# Patient Record
Sex: Male | Born: 1990 | Race: Black or African American | Hispanic: No | Marital: Single | Smoking: Current every day smoker
Health system: Southern US, Community
[De-identification: ages and names within clinical notes are randomized; demographics above are authoritative.]

## PROBLEM LIST (undated history)

## (undated) DIAGNOSIS — J302 Other seasonal allergic rhinitis: Secondary | ICD-10-CM

## (undated) DIAGNOSIS — W3400XA Accidental discharge from unspecified firearms or gun, initial encounter: Secondary | ICD-10-CM

---
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
## 2010-01-01 ENCOUNTER — Emergency Department (HOSPITAL_COMMUNITY): Admission: EM | Admit: 2010-01-01 | Discharge: 2010-01-01 | Payer: Self-pay | Admitting: Emergency Medicine

## 2011-09-16 ENCOUNTER — Encounter (HOSPITAL_COMMUNITY): Payer: Self-pay | Admitting: *Deleted

## 2011-09-16 ENCOUNTER — Emergency Department (HOSPITAL_COMMUNITY)
Admission: EM | Admit: 2011-09-16 | Discharge: 2011-09-16 | Disposition: A | Discharge status: Home / Self Care | Payer: No Typology Code available for payment source | Attending: Emergency Medicine | Admitting: Emergency Medicine

## 2011-09-16 ENCOUNTER — Emergency Department (HOSPITAL_COMMUNITY): Payer: Self-pay

## 2011-09-16 DIAGNOSIS — F17200 Nicotine dependence, unspecified, uncomplicated: Secondary | ICD-10-CM | POA: Insufficient documentation

## 2011-09-16 DIAGNOSIS — R51 Headache: Secondary | ICD-10-CM | POA: Insufficient documentation

## 2011-09-16 DIAGNOSIS — M79609 Pain in unspecified limb: Secondary | ICD-10-CM | POA: Insufficient documentation

## 2011-09-16 DIAGNOSIS — IMO0001 Reserved for inherently not codable concepts without codable children: Secondary | ICD-10-CM | POA: Insufficient documentation

## 2011-09-16 DIAGNOSIS — M542 Cervicalgia: Secondary | ICD-10-CM | POA: Insufficient documentation

## 2011-09-16 DIAGNOSIS — R079 Chest pain, unspecified: Secondary | ICD-10-CM | POA: Insufficient documentation

## 2011-09-16 DIAGNOSIS — T148XXA Other injury of unspecified body region, initial encounter: Secondary | ICD-10-CM | POA: Insufficient documentation

## 2011-09-16 HISTORY — DX: Other seasonal allergic rhinitis: J30.2

## 2011-09-16 MED ORDER — CYCLOBENZAPRINE HCL 5 MG PO TABS: 5.0000 mg | ORAL_TABLET | Freq: Three times a day (TID) | ORAL | Status: AC | PRN

## 2011-09-16 MED ORDER — NAPROXEN 500 MG PO TABS: 500.0000 mg | ORAL_TABLET | Freq: Two times a day (BID) | ORAL | Status: DC
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
## 2011-11-13 ENCOUNTER — Emergency Department (HOSPITAL_COMMUNITY)
Admission: EM | Admit: 2011-11-13 | Discharge: 2011-11-13 | Disposition: A | Discharge status: Home / Self Care | Payer: Self-pay | Attending: Emergency Medicine | Admitting: Emergency Medicine

## 2011-11-13 ENCOUNTER — Encounter (HOSPITAL_COMMUNITY): Payer: Self-pay | Admitting: Emergency Medicine

## 2011-11-13 DIAGNOSIS — F17200 Nicotine dependence, unspecified, uncomplicated: Secondary | ICD-10-CM | POA: Insufficient documentation

## 2011-11-13 DIAGNOSIS — IMO0002 Reserved for concepts with insufficient information to code with codable children: Secondary | ICD-10-CM | POA: Insufficient documentation

## 2011-11-13 DIAGNOSIS — W57XXXA Bitten or stung by nonvenomous insect and other nonvenomous arthropods, initial encounter: Secondary | ICD-10-CM

## 2011-11-13 DIAGNOSIS — Y9289 Other specified places as the place of occurrence of the external cause: Secondary | ICD-10-CM | POA: Insufficient documentation

## 2011-11-13 DIAGNOSIS — S30861A Insect bite (nonvenomous) of abdominal wall, initial encounter: Secondary | ICD-10-CM

## 2011-11-13 NOTE — ED Notes (Signed)
Pt reports he had 4 ticks on him today and he removed them. One was on his testicles so he wanted to get the area checked.

## 2011-11-13 NOTE — ED Notes (Signed)
PT. LEFT WITHOUT NOTIFYING ED STAFF, NP NOTIFIED .

## 2011-11-13 NOTE — Discharge Instructions (Signed)
Return for any body aches, fevers or rash

## 2011-11-13 NOTE — ED Provider Notes (Signed)
History     CSN: [id number]  Arrival date & time 11/13/11  2044   First MD Initiated Contact with Patient 11/13/11 2248      Chief Complaint  Patient presents with  . Foreign Body in Skin    (Consider location/radiation/quality/duration/timing/severity/associated sxs/prior treatment) HPI Comments: As when fishing in the woods on Friday and today noticed he had several ticks in his groin area, which he is removed, but is concerned because he has some "knots in his right groin area.  Denies fever, myalgias, rash  The history is provided by the patient.    Past Medical History  Diagnosis Date  . Seasonal allergies     No past surgical history on file.  No family history on file.  History  Substance Use Topics  . Smoking status: Current Everyday Smoker    Types: Cigarettes  . Smokeless tobacco: Not on file  . Alcohol Use: No      Review of Systems  Constitutional: Negative for fever and chills.  Musculoskeletal: Negative for myalgias.  Neurological: Negative for dizziness and headaches.    Allergies  Review of patient's allergies indicates no known allergies.  Home Medications  No current outpatient prescriptions on file.  BP 126/71  Pulse 54  Temp 98.5 F (36.9 C)  Resp 16  SpO2 100%  Physical Exam  Constitutional: He is oriented to person, place, and time. He appears well-developed and well-nourished.  HENT:  Head: Normocephalic.  Eyes: Pupils are equal, round, and reactive to light.  Neck: Normal range of motion.  Cardiovascular: Normal rate.   Pulmonary/Chest: Effort normal.  Musculoskeletal: Normal range of motion. He exhibits no edema and no tenderness.  Neurological: He is alert and oriented to person, place, and time.  Skin: Skin is warm and dry.       Light erythema at the site of the tick bites without edema or tenderness    ED Course  Procedures (including critical care time)  Labs Reviewed - No data to display No results  found.   1. Tick bite of groin, initial encounter       MDM  Tick bites        Arman Filter, NP 11/13/11 2326  Arman Filter, NP 11/13/11 [phone]

## 2011-11-14 NOTE — ED Provider Notes (Signed)
Medical screening examination/treatment/procedure(s) were performed by non-physician practitioner and as supervising physician I was immediately available for consultation/collaboration.  Ethelda Chick, MD 11/14/11 [phone]

## 2012-05-11 ENCOUNTER — Emergency Department (HOSPITAL_COMMUNITY)
Admission: EM | Admit: 2012-05-11 | Discharge: 2012-05-11 | Disposition: A | Discharge status: Home / Self Care | Payer: Self-pay | Attending: Emergency Medicine | Admitting: Emergency Medicine

## 2012-05-11 ENCOUNTER — Encounter (HOSPITAL_COMMUNITY): Payer: Self-pay | Admitting: Physical Medicine and Rehabilitation

## 2012-05-11 ENCOUNTER — Emergency Department (HOSPITAL_COMMUNITY)
Admission: EM | Admit: 2012-05-11 | Discharge: 2012-05-11 | Disposition: A | Discharge status: Home / Self Care | Payer: Self-pay | Source: Home / Self Care

## 2012-05-11 DIAGNOSIS — L989 Disorder of the skin and subcutaneous tissue, unspecified: Secondary | ICD-10-CM | POA: Insufficient documentation

## 2012-05-11 DIAGNOSIS — F17200 Nicotine dependence, unspecified, uncomplicated: Secondary | ICD-10-CM | POA: Insufficient documentation

## 2012-05-11 LAB — URINE MICROSCOPIC-ADD ON

## 2012-05-11 LAB — URINALYSIS, ROUTINE W REFLEX MICROSCOPIC: Protein, ur: NEGATIVE mg/dL

## 2012-05-11 NOTE — ED Notes (Signed)
Discharge instructions reviewed. Pt verbalized understanding.  

## 2012-05-11 NOTE — ED Provider Notes (Cosign Needed)
History   This chart was scribed for Ward Givens, MD, by Frederik Pear. The patient was seen in room TR08C/TR08C and the patient's care was started at 1345.    CSN: [id number]  Arrival date & time 05/11/12  1158   First MD Initiated Contact with Patient 05/11/12 1258      Chief Complaint  Patient presents with  . SEXUALLY TRANSMITTED DISEASE    (Consider location/radiation/quality/duration/timing/severity/associated sxs/prior treatment) HPI  Derrick [name] is a 21 y.o. male who presents to the Emergency Department complaining of a mild, constant pen-tip sized raised lesion on the penis that appeared 3 days ago. He states he had mild pain. He denies any associated itching, bleeding, penile drip, or dysuria. He states the area has now "scabbed over" The pt has no new sexual partners or h/o of STD.   He is a current everyday smoker with no medical conditions other than season allergies. 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   PCP none  Past Medical History  Diagnosis Date  . Seasonal allergies     No past surgical history on file.  History reviewed. No pertinent family history.  History  Substance Use Topics  . Smoking status: Current Every Day Smoker    Types: Cigarettes  . Smokeless tobacco: Not on file  . Alcohol Use: No   employed   Review of Systems  Genitourinary: Positive for genital sores. Negative for dysuria.       Mass on penis.  All other systems reviewed and are negative.    Allergies  Review of patient's allergies indicates no known allergies.  Home Medications  No current outpatient prescriptions on file.  BP 129/60  Pulse 64  Temp 97.6 F (36.4 C) (Oral)  Resp 20  SpO2 99%  Vital signs normal    Physical Exam  Constitutional: He is oriented to person, place, and time. He appears well-developed and well-nourished.  Non-toxic appearance. He does not appear ill. No distress.  HENT:  Head: Normocephalic and atraumatic.    Right Ear: External ear normal.  Left Ear: External ear normal.  Nose: Nose normal. No mucosal edema or rhinorrhea.  Mouth/Throat: Mucous membranes are normal. No dental abscesses or uvula swelling.  Eyes: Conjunctivae normal and EOM are normal. Pupils are equal, round, and reactive to light.  Neck: Normal range of motion and full passive range of motion without pain. Neck supple.  Pulmonary/Chest: No respiratory distress. He has no rhonchi. He exhibits no crepitus.  Abdominal: Soft. Normal appearance.  Genitourinary: Testes normal and penis normal. Right testis shows no mass, no swelling and no tenderness. Left testis shows no mass, no swelling and no tenderness. Uncircumcised. No phimosis, paraphimosis, hypospadias or penile erythema. No discharge found.       No raised lesion or ulcers. No scabbing Small area about 3 mm in size with a difference in the skin tone  Musculoskeletal: Normal range of motion. He exhibits no edema and no tenderness.       Moves all extremities well.   Lymphadenopathy:       Right: Inguinal adenopathy present.       Left: Inguinal adenopathy present.       Multiple bilateral inguinal lymph nodes are present.  Neurological: He is alert and oriented to person, place, and time. He has normal strength. No cranial nerve deficit.  Skin: Skin is warm, dry and intact. No rash noted. No erythema. No pallor.  Psychiatric: He has a normal mood and  affect. His speech is normal and behavior is normal. His mood appears not anxious.    ED Course  Procedures (including critical care time)  DIAGNOSTIC STUDIES: Oxygen Saturation is 99% on room air, normal by my interpretation.    COORDINATION OF CARE:  13:48- Discussed planned course of treatment with the patient, including UA and blood work, who is agreeable at this time.     Results for orders placed during the hospital encounter of 05/11/12  URINALYSIS, ROUTINE W REFLEX MICROSCOPIC      Component Value Range    Color, Urine YELLOW  YELLOW   APPearance CLEAR  CLEAR   Specific Gravity, Urine 1.027  1.005 - 1.030   pH 6.0  5.0 - 8.0   Glucose, UA NEGATIVE  NEGATIVE mg/dL   Hgb urine dipstick NEGATIVE  NEGATIVE   Bilirubin Urine NEGATIVE  NEGATIVE   Ketones, ur 15 (*) NEGATIVE mg/dL   Protein, ur NEGATIVE  NEGATIVE mg/dL   Urobilinogen, UA 1.0  0.0 - 1.0 mg/dL   Nitrite NEGATIVE  NEGATIVE   Leukocytes, UA SMALL (*) NEGATIVE  URINE MICROSCOPIC-ADD ON      Component Value Range   WBC, UA 0-2  <3 WBC/hpf   Urine-Other MUCOUS PRESENT     Laboratory interpretation all normal except  GC, Chlamydia and RPR pending      1. Skin lesion    Plan discharge  Devoria Albe, MD, FACEP   MDM    I personally performed the services described in this documentation, which was scribed in my presence. The recorded information has been reviewed and considered.  Devoria Albe, MD, Armando Gang        Ward Givens, MD 05/11/12 [phone]

## 2012-05-11 NOTE — ED Notes (Signed)
Pt states he had a bump his penis 3 days ago and it went away and formed a scab. Denies urination difficulties or burning, and discharge.

## 2012-05-11 NOTE — ED Notes (Signed)
Pt presents to department for evaluation of possible STD. States he noticed small "bump" on penis x3 days ago. Denies penile discharge. Denies urinary symptoms. Denies pain at the time. No signs of distress noted.

## 2012-07-04 ENCOUNTER — Encounter (HOSPITAL_COMMUNITY): Payer: Self-pay | Admitting: *Deleted

## 2012-07-04 ENCOUNTER — Emergency Department (HOSPITAL_COMMUNITY)
Admission: EM | Admit: 2012-07-04 | Discharge: 2012-07-04 | Disposition: A | Discharge status: Home / Self Care | Payer: Self-pay | Attending: Emergency Medicine | Admitting: Emergency Medicine

## 2012-07-04 DIAGNOSIS — A6000 Herpesviral infection of urogenital system, unspecified: Secondary | ICD-10-CM | POA: Insufficient documentation

## 2012-07-04 DIAGNOSIS — F17200 Nicotine dependence, unspecified, uncomplicated: Secondary | ICD-10-CM | POA: Insufficient documentation

## 2012-07-04 MED ORDER — ACYCLOVIR 400 MG PO TABS: 400.0000 mg | ORAL_TABLET | Freq: Three times a day (TID) | ORAL | Status: DC

## 2012-07-04 MED ORDER — TRAMADOL HCL 50 MG PO TABS: 50.0000 mg | ORAL_TABLET | Freq: Four times a day (QID) | ORAL | Status: DC | PRN

## 2012-07-04 NOTE — ED Notes (Signed)
Per Pt:  Pt presents with a chief complaint of genital warts, no discharge.  Pt st's pain is 6-7/10.  Pt reports last sexual intercourse was 1-2 weeks ago.

## 2012-07-04 NOTE — ED Provider Notes (Signed)
History     CSN: [id number]  Arrival date & time 07/04/12  0700   First MD Initiated Contact with Patient 07/04/12 [phone]      No chief complaint on file.   (Consider location/radiation/quality/duration/timing/severity/associated sxs/prior treatment) HPI Pt has presented several times times to to ED for genital lesions and had neg GC, RPR. Now with several days of new outbreak of painful lesions on penis. No D/C. No systemic symptoms.  Past Medical History  Diagnosis Date  . Seasonal allergies     History reviewed. No pertinent past surgical history.  No family history on file.  History  Substance Use Topics  . Smoking status: Current Every Day Smoker    Types: Cigarettes  . Smokeless tobacco: Not on file  . Alcohol Use: No      Review of Systems  Constitutional: Negative for fever, chills and fatigue.  Genitourinary: Positive for penile pain. Negative for dysuria, hematuria, discharge, penile swelling, scrotal swelling and testicular pain.  Skin: Positive for rash.  All other systems reviewed and are negative.    Allergies  Review of patient's allergies indicates no known allergies.  Home Medications   Current Outpatient Rx  Name  Route  Sig  Dispense  Refill  . ACYCLOVIR 400 MG PO TABS   Oral   Take 1 tablet (400 mg total) by mouth 3 (three) times daily.   30 tablet   0   . TRAMADOL HCL 50 MG PO TABS   Oral   Take 1 tablet (50 mg total) by mouth every 6 (six) hours as needed for pain.   15 tablet   0     BP 134/57  Pulse 76  Temp 98.7 F (37.1 C) (Oral)  Resp 18  SpO2 97%  Physical Exam  Nursing note and vitals reviewed. Constitutional: He is oriented to person, place, and time. He appears well-developed and well-nourished. No distress.  HENT:  Head: Normocephalic and atraumatic.  Mouth/Throat: Oropharynx is clear and moist.  Eyes: Pupils are equal, round, and reactive to light.  Neck: Normal range of motion. Neck supple.  Cardiovascular:  Normal rate.   Pulmonary/Chest: Effort normal.  Abdominal: Soft. There is no tenderness. There is no rebound and no guarding.  Genitourinary:       Clustered vesicular lesions on shaft of penis with crusted over, healing lesions as well. +inguinal lymphadenopathy. No penile D/C. No testicular pain or mass  Musculoskeletal: Normal range of motion. He exhibits no edema and no tenderness.  Neurological: He is alert and oriented to person, place, and time.  Skin: Skin is warm and dry. No rash noted. No erythema.  Psychiatric: He has a normal mood and affect. His behavior is normal.    ED Course  Procedures (including critical care time)  Labs Reviewed - No data to display No results found.   1. Herpes genitalis       MDM  Advised to use barrier protection and follow up with [facility]        Loren Racer, MD 07/04/12 [phone]

## 2013-01-15 ENCOUNTER — Encounter (HOSPITAL_COMMUNITY): Payer: Self-pay | Admitting: *Deleted

## 2013-01-15 ENCOUNTER — Emergency Department (HOSPITAL_COMMUNITY)
Admission: EM | Admit: 2013-01-15 | Discharge: 2013-01-15 | Disposition: A | Discharge status: Home / Self Care | Payer: No Typology Code available for payment source | Attending: Emergency Medicine | Admitting: Emergency Medicine

## 2013-01-15 DIAGNOSIS — S39012A Strain of muscle, fascia and tendon of lower back, initial encounter: Secondary | ICD-10-CM

## 2013-01-15 DIAGNOSIS — S6990XA Unspecified injury of unspecified wrist, hand and finger(s), initial encounter: Secondary | ICD-10-CM | POA: Insufficient documentation

## 2013-01-15 DIAGNOSIS — S6980XA Other specified injuries of unspecified wrist, hand and finger(s), initial encounter: Secondary | ICD-10-CM | POA: Insufficient documentation

## 2013-01-15 DIAGNOSIS — Y92410 Unspecified street and highway as the place of occurrence of the external cause: Secondary | ICD-10-CM | POA: Insufficient documentation

## 2013-01-15 DIAGNOSIS — F17200 Nicotine dependence, unspecified, uncomplicated: Secondary | ICD-10-CM | POA: Insufficient documentation

## 2013-01-15 DIAGNOSIS — Y9389 Activity, other specified: Secondary | ICD-10-CM | POA: Insufficient documentation

## 2013-01-15 DIAGNOSIS — IMO0002 Reserved for concepts with insufficient information to code with codable children: Secondary | ICD-10-CM | POA: Insufficient documentation

## 2013-01-15 MED ORDER — NAPROXEN 500 MG PO TABS: 500.0000 mg | ORAL_TABLET | Freq: Two times a day (BID) | ORAL | Status: DC

## 2013-01-15 MED ORDER — METHOCARBAMOL 500 MG PO TABS: 500.0000 mg | ORAL_TABLET | Freq: Two times a day (BID) | ORAL | Status: DC

## 2013-01-15 NOTE — ED Notes (Signed)
Pt comfortable with d/c and f/u instructions. Prescriptions x2. 
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
## 2013-01-15 NOTE — ED Notes (Signed)
ED PA at bedside

## 2013-01-17 NOTE — ED Provider Notes (Signed)
Medical screening examination/treatment/procedure(s) were performed by non-physician practitioner and as supervising physician I was immediately available for consultation/collaboration.   Richardean Canal, MD 01/17/13 [phone]

## 2014-06-01 ENCOUNTER — Encounter (HOSPITAL_COMMUNITY): Payer: Self-pay | Admitting: *Deleted

## 2014-06-01 ENCOUNTER — Emergency Department (HOSPITAL_COMMUNITY)
Admission: EM | Admit: 2014-06-01 | Discharge: 2014-06-01 | Disposition: A | Discharge status: Home / Self Care | Payer: No Typology Code available for payment source | Attending: Emergency Medicine | Admitting: Emergency Medicine

## 2014-06-01 DIAGNOSIS — L0291 Cutaneous abscess, unspecified: Secondary | ICD-10-CM

## 2014-06-01 DIAGNOSIS — Z79899 Other long term (current) drug therapy: Secondary | ICD-10-CM | POA: Insufficient documentation

## 2014-06-01 DIAGNOSIS — L02215 Cutaneous abscess of perineum: Secondary | ICD-10-CM | POA: Insufficient documentation

## 2014-06-01 DIAGNOSIS — Z720 Tobacco use: Secondary | ICD-10-CM | POA: Insufficient documentation

## 2014-06-01 MED ORDER — LIDOCAINE-EPINEPHRINE (PF) 2 %-1:200000 IJ SOLN
10.0000 mL | Freq: Once | INTRAMUSCULAR | Status: AC
  Administered 2014-06-01: 10 mL
  Filled 2014-06-01: qty 20

## 2014-06-01 MED ORDER — CEPHALEXIN 500 MG PO CAPS: 500.0000 mg | ORAL_CAPSULE | Freq: Four times a day (QID) | ORAL | Status: DC

## 2014-06-01 MED ORDER — OXYCODONE-ACETAMINOPHEN 5-325 MG PO TABS: 2.0000 | ORAL_TABLET | Freq: Four times a day (QID) | ORAL | Status: DC | PRN

## 2014-06-01 NOTE — ED Notes (Signed)
Pt in c/o possible abscess to base of testicles, states this has been there for a few days, denies drainage, c/o pain with movement

## 2014-06-01 NOTE — ED Provider Notes (Signed)
CSN: [id number]     Arrival date & time 06/01/14  1846 History   First MD Initiated Contact with Patient 06/01/14 2035     Chief Complaint  Patient presents with  . Abscess     (Consider location/radiation/quality/duration/timing/severity/associated sxs/prior Treatment) HPI Comments: Patient presents to the emergency department with chief complaint of mass. He states that he has noticed a painful mass of the base of his testicles. States that his been there for the past 3 days. He states that his been growing. He reports increased pain. He denies any associated fevers, chills, nausea, vomiting, or discharge. It is worsened with palpation and movement. He has never had anything like this before. He has no pertinent past medical history.  The history is provided by the patient. No language interpreter was used.    Past Medical History  Diagnosis Date  . Seasonal allergies    History reviewed. No pertinent past surgical history. History reviewed. No pertinent family history. History  Substance Use Topics  . Smoking status: Current Every Day Smoker    Types: Cigarettes  . Smokeless tobacco: Not on file  . Alcohol Use: No    Review of Systems  Constitutional: Negative for fever and chills.  Respiratory: Negative for shortness of breath.   Cardiovascular: Negative for chest pain.  Gastrointestinal: Negative for nausea, vomiting, diarrhea and constipation.  Genitourinary: Negative for dysuria.  All other systems reviewed and are negative.     Allergies  Review of patient's allergies indicates no known allergies.  Home Medications   Prior to Admission medications   Medication Sig Start Date End Date Taking? Authorizing Provider  methocarbamol (ROBAXIN) 500 MG tablet Take 1 tablet (500 mg total) by mouth 2 (two) times daily. Patient not taking: Reported on 06/01/2014 01/15/13   Antony MaduraKelly Humes, PA-C  naproxen (NAPROSYN) 500 MG tablet Take 1 tablet (500 mg total) by mouth 2 (two)  times daily. Patient not taking: Reported on 06/01/2014 01/15/13   Antony MaduraKelly Humes, PA-C   BP 117/67 mmHg  Pulse 71  Temp(Src) 97.5 F (36.4 C)  Resp 18  Ht 5\' 7"  (1.702 m)  Wt 155 lb (70.308 kg)  BMI 24.27 kg/m2  SpO2 100% Physical Exam  Constitutional: He is oriented to person, place, and time. He appears well-developed and well-nourished.  HENT:  Head: Normocephalic and atraumatic.  Eyes: Conjunctivae and EOM are normal. Pupils are equal, round, and reactive to light. Right eye exhibits no discharge. Left eye exhibits no discharge. No scleral icterus.  Neck: Normal range of motion. Neck supple. No JVD present.  Cardiovascular: Normal rate, regular rhythm and normal heart sounds.  Exam reveals no gallop and no friction rub.   No murmur heard. Pulmonary/Chest: Effort normal and breath sounds normal. No respiratory distress. He has no wheezes. He has no rales. He exhibits no tenderness.  Abdominal: Soft. He exhibits no distension and no mass. There is no tenderness. There is no rebound and no guarding.  Genitourinary:  4 x 3 cm fluctuant area in the perineum bordering the scrotum concerning for abscess  No tenderness of the testicles, no abnormality about the penis, no discharge  Musculoskeletal: Normal range of motion. He exhibits no edema or tenderness.  Neurological: He is alert and oriented to person, place, and time.  Skin: Skin is warm and dry.  Psychiatric: He has a normal mood and affect. His behavior is normal. Judgment and thought content normal.  Nursing note and vitals reviewed.   ED Course  Procedures (including  critical care time) Results for orders placed or performed during the hospital encounter of 05/11/12  Urinalysis, Routine w reflex microscopic  Result Value Ref Range   Color, Urine YELLOW YELLOW   APPearance CLEAR CLEAR   Specific Gravity, Urine 1.027 1.005 - 1.030   pH 6.0 5.0 - 8.0   Glucose, UA NEGATIVE NEGATIVE mg/dL   Hgb urine dipstick NEGATIVE  NEGATIVE   Bilirubin Urine NEGATIVE NEGATIVE   Ketones, ur 15 (A) NEGATIVE mg/dL   Protein, ur NEGATIVE NEGATIVE mg/dL   Urobilinogen, UA 1.0 0.0 - 1.0 mg/dL   Nitrite NEGATIVE NEGATIVE   Leukocytes, UA SMALL (A) NEGATIVE  Urine microscopic-add on  Result Value Ref Range   WBC, UA 0-2 <3 WBC/hpf   Urine-Other MUCOUS PRESENT   GC/chlamydia probe amp, genital  Result Value Ref Range   GC Probe Amp, Genital NEGATIVE NEGATIVE   Chlamydia, DNA Probe NEGATIVE NEGATIVE  RPR  Result Value Ref Range   RPR NON REACTIVE NON REACTIVE   No results found.     EKG Interpretation None      MDM   Final diagnoses:  Abscess   Patient with fluctuant mass concerning for abscess in the perineum bordering the scrotum.  Patient seen by and discussed with Dr. Jodi MourningZavitz.  Incision and drainage performed by Dr. Jodi MourningZavitz.  Bedside soft tissue US performed prior to I and D.    Will discharge to home with abx, pain meds, close return precautions and instructions to follow-up with urology in 2-3 days.    Roxy Horsemanobert Marti Mclane, PA-C 06/01/14 [phone]  Enid SkeensJoshua M Zavitz, MD 06/02/14 [phone]

## 2014-06-01 NOTE — ED Notes (Signed)
MD and PA at the bedside attempting to drain abscess.

## 2014-06-01 NOTE — Discharge Instructions (Signed)

## 2014-06-04 LAB — WOUND CULTURE

## 2014-07-17 DIAGNOSIS — W3400XA Accidental discharge from unspecified firearms or gun, initial encounter: Secondary | ICD-10-CM

## 2014-07-17 DIAGNOSIS — Y249XXA Unspecified firearm discharge, undetermined intent, initial encounter: Secondary | ICD-10-CM

## 2014-07-17 HISTORY — DX: Accidental discharge from unspecified firearms or gun, initial encounter: W34.00XA

## 2014-07-17 HISTORY — DX: Unspecified firearm discharge, undetermined intent, initial encounter: Y24.9XXA

## 2015-06-29 DIAGNOSIS — W3400XA Accidental discharge from unspecified firearms or gun, initial encounter: Secondary | ICD-10-CM | POA: Insufficient documentation

## 2015-06-29 DIAGNOSIS — S36039A Unspecified laceration of spleen, initial encounter: Secondary | ICD-10-CM | POA: Insufficient documentation

## 2015-06-29 DIAGNOSIS — Y249XXA Unspecified firearm discharge, undetermined intent, initial encounter: Secondary | ICD-10-CM | POA: Insufficient documentation
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
## 2015-06-29 NOTE — ED Notes (Signed)
 Xray at bedside for portable exams
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
## 2015-06-29 NOTE — ED Notes (Signed)
 Patient was dropped off by friends at the main entrance of the hospital. Security transported patient to ED. At that time a Level 1 was paged out.

## 2015-07-04 NOTE — Unmapped External Note (Signed)
 Dressing saturated, removed, new dressing applied.  Electronically signed by: Bernarda Rumalda Colonel, RN 07/04/15 1536

## 2015-07-05 NOTE — Unmapped External Note (Signed)
 Care Coordination Update  Date: 07/05/2015   Time: 2:56 PM   Inpatient CM spoke with pt to update insurance status.  Pt stated that he was unsure of insurance coverage, that his mother handled that for him.  He would speak with his mother and obtain information.  Will continue to follow for discharge needs  Diamond Donovan, RN  Case Manager   Electronically signed by: Diamond Donovan, RN 07/05/15 [phone]

## 2015-07-05 NOTE — Unmapped External Note (Signed)
 Pt evaluated by RT per protocol. Pt will continue with current therapies at this time. RT will continue to monitor and adjust therapies as indicated.   07/05/15 2329  RT Evaluation  $ Subsequent Patient Assessment Yes  Eval and Treat Assessment  Respiratory Pattern Regular;Easy;Unlabored  Chest Assessment Chest expansion symmetrical  Cough Strong;Productive  Bilateral Breath Sounds Diminished;Clear  R Breath Sounds Diminished;Clear  L Breath Sounds Diminished;Clear  SpO2 100 %  O2 Device None (Room air)  FiO2 (%) 21 %  Tobacco use reviewed  Reviewed  Past Medical Hx of Respiratory Compromise/Disorder?  No  Respiratory Compromise Hx Recent Surgery Patient  Home Regimen No  Cough Description  Sputum Amount UTA  Sputum Color UTA  Sputum Consistency UTA  Sputum How Obtained Spontaneous cough  Imaging Results  Latest Image Results 07/05/15  Chest X-ray Atelectasis;Other (comment)  X-ray Results comment 1. Trace left apical pneumothorax with left chest tube in place. 2. Bibasilar atelectasis and left basilar contusion.  RT Recommendations  Bronchodilator Indications Not Indicated  Airway Clearance Indications Not indicated  Airway Clearance Recommendations Incentive Spirometry  Frequency (q1 while awake)  Airway Clearance Recommendations Turn, Deep Breathe, and Cough  Frequency q2 hrs (while awake)  Resp. Therapy Follow-up PRN    Electronically signed by: Pecola Christobal Quint, RRT 07/05/15 2333

## 2015-07-18 HISTORY — PX: EXPLORATORY LAPAROTOMY: SUR591

## 2016-05-25 ENCOUNTER — Encounter (HOSPITAL_COMMUNITY): Payer: Self-pay

## 2016-05-25 DIAGNOSIS — W2201XA Walked into wall, initial encounter: Secondary | ICD-10-CM | POA: Insufficient documentation

## 2016-05-25 DIAGNOSIS — Y929 Unspecified place or not applicable: Secondary | ICD-10-CM | POA: Insufficient documentation

## 2016-05-25 DIAGNOSIS — Y999 Unspecified external cause status: Secondary | ICD-10-CM | POA: Insufficient documentation

## 2016-05-25 DIAGNOSIS — Z87891 Personal history of nicotine dependence: Secondary | ICD-10-CM | POA: Insufficient documentation

## 2016-05-25 DIAGNOSIS — M79641 Pain in right hand: Secondary | ICD-10-CM | POA: Insufficient documentation

## 2016-05-25 DIAGNOSIS — Y9389 Activity, other specified: Secondary | ICD-10-CM | POA: Insufficient documentation
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
## 2016-05-26 ENCOUNTER — Emergency Department (HOSPITAL_COMMUNITY)
Admission: EM | Admit: 2016-05-26 | Discharge: 2016-05-26 | Disposition: A | Discharge status: Home / Self Care | Payer: Self-pay | Attending: Emergency Medicine | Admitting: Emergency Medicine

## 2016-05-26 ENCOUNTER — Emergency Department (HOSPITAL_COMMUNITY): Payer: Self-pay

## 2016-05-26 DIAGNOSIS — M79641 Pain in right hand: Secondary | ICD-10-CM

## 2016-05-26 HISTORY — DX: Accidental discharge from unspecified firearms or gun, initial encounter: W34.00XA

## 2016-05-26 MED ORDER — IBUPROFEN 600 MG PO TABS: 600.0000 mg | ORAL_TABLET | Freq: Three times a day (TID) | ORAL | 0 refills | Status: DC

## 2016-05-26 NOTE — ED Notes (Signed)
Pt states he only wants to address his hand injury, does not want to work up the abd pain

## 2016-05-26 NOTE — Progress Notes (Signed)
Orthopedic Tech Progress Note Patient Details:  Rexene AgentDeondre L Orton 07/16/1991 [id number]  Ortho Devices Type of Ortho Device: Short arm splint Ortho Device/Splint Location: Volar Splint/ RUE Ortho Device/Splint Interventions: Application As per Doctor's orders.  Alvina ChouWilliams, Marielle Mantione C 05/26/2016, 4:35 AM
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
## 2016-08-25 ENCOUNTER — Encounter (HOSPITAL_COMMUNITY): Payer: Self-pay | Admitting: *Deleted

## 2016-08-25 ENCOUNTER — Emergency Department (HOSPITAL_COMMUNITY): Payer: Self-pay

## 2016-08-25 ENCOUNTER — Emergency Department (HOSPITAL_COMMUNITY)
Admission: EM | Admit: 2016-08-25 | Discharge: 2016-08-25 | Disposition: A | Discharge status: Home / Self Care | Payer: Self-pay | Attending: Emergency Medicine | Admitting: Emergency Medicine

## 2016-08-25 DIAGNOSIS — Z87891 Personal history of nicotine dependence: Secondary | ICD-10-CM | POA: Insufficient documentation

## 2016-08-25 DIAGNOSIS — S62346A Nondisplaced fracture of base of fifth metacarpal bone, right hand, initial encounter for closed fracture: Secondary | ICD-10-CM | POA: Insufficient documentation

## 2016-08-25 DIAGNOSIS — Y929 Unspecified place or not applicable: Secondary | ICD-10-CM | POA: Insufficient documentation

## 2016-08-25 DIAGNOSIS — Y999 Unspecified external cause status: Secondary | ICD-10-CM | POA: Insufficient documentation

## 2016-08-25 DIAGNOSIS — Y939 Activity, unspecified: Secondary | ICD-10-CM | POA: Insufficient documentation

## 2016-08-25 DIAGNOSIS — W2201XA Walked into wall, initial encounter: Secondary | ICD-10-CM | POA: Insufficient documentation

## 2016-08-25 MED ORDER — HYDROCODONE-ACETAMINOPHEN 5-325 MG PO TABS: 2.0000 | ORAL_TABLET | ORAL | 0 refills | Status: DC | PRN

## 2016-08-25 NOTE — Discharge Instructions (Signed)
Return if any problems.

## 2016-08-25 NOTE — ED Notes (Signed)
Patient transported to X-ray 

## 2016-08-25 NOTE — ED Notes (Signed)
Paged ortho tech x 2.
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
## 2016-08-25 NOTE — Progress Notes (Signed)
Orthopedic Tech Progress Note Patient Details:  Derrick [name] 08/14/1990 [id number]  Ortho Devices Type of Ortho Device: Ace wrap, Arm sling Ortho Device/Splint Location: rue Ortho Device/Splint Interventions: Application   [name] 08/25/2016, 10:14 AM As ordered by Dr. Jeanette CapriceSophia
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
## 2016-08-31 DIAGNOSIS — S62346A Nondisplaced fracture of base of fifth metacarpal bone, right hand, initial encounter for closed fracture: Secondary | ICD-10-CM | POA: Insufficient documentation

## 2016-09-20 ENCOUNTER — Encounter (HOSPITAL_COMMUNITY): Payer: Self-pay | Admitting: Emergency Medicine

## 2016-09-20 ENCOUNTER — Emergency Department (HOSPITAL_COMMUNITY)
Admission: EM | Admit: 2016-09-20 | Discharge: 2016-09-20 | Disposition: A | Discharge status: Home / Self Care | Payer: Self-pay | Attending: Emergency Medicine | Admitting: Emergency Medicine

## 2016-09-20 DIAGNOSIS — L02224 Furuncle of groin: Secondary | ICD-10-CM | POA: Insufficient documentation

## 2016-09-20 DIAGNOSIS — Z87891 Personal history of nicotine dependence: Secondary | ICD-10-CM | POA: Insufficient documentation

## 2016-09-20 DIAGNOSIS — L0291 Cutaneous abscess, unspecified: Secondary | ICD-10-CM

## 2016-09-20 MED ORDER — DEXAMETHASONE SODIUM PHOSPHATE 10 MG/ML IJ SOLN: 10.0000 mg | Freq: Once | INTRAMUSCULAR | Status: DC

## 2016-09-20 MED ORDER — LIDOCAINE HCL (PF) 1 % IJ SOLN
5.0000 mL | Freq: Once | INTRAMUSCULAR | Status: AC
  Administered 2016-09-20: 5 mL
  Filled 2016-09-20: qty 5

## 2016-09-20 MED ORDER — KETOROLAC TROMETHAMINE 30 MG/ML IJ SOLN: 30.0000 mg | Freq: Once | INTRAMUSCULAR | Status: DC

## 2016-09-20 MED ORDER — SODIUM BICARBONATE 4 % IV SOLN
5.0000 mL | Freq: Once | INTRAVENOUS | Status: AC
  Administered 2016-09-20: 5 mL via SUBCUTANEOUS
  Filled 2016-09-20: qty 5

## 2016-09-20 MED ORDER — SODIUM CHLORIDE 0.9 % IV BOLUS (SEPSIS): 1000.0000 mL | Freq: Once | INTRAVENOUS | Status: DC

## 2016-09-20 NOTE — Discharge Instructions (Signed)
Please have your area of incision and drainage rechecked in 2 days by primary care provider or urgent care or return here for reevaluation. Use warm soaks and flushing to the area.  Get help right away if: You have severe pain or bleeding. You cannot eat or drink without vomiting. You have decreased urine output. You become short of breath. You have chest pain. You cough up blood. The area where the incision and drainage occurred becomes numb or it tingles.

## 2016-09-20 NOTE — ED Triage Notes (Signed)
Pt states he has a red painful sore on his buttocks for 1 week.

## 2016-09-20 NOTE — ED Provider Notes (Signed)
Medical screening examination/treatment/procedure(s) were conducted as a shared visit with non-physician practitioner(s) and myself.  I personally evaluated the patient during the encounter.   EKG Interpretation None      26 y.o. male presents with abscess to right groin. No acute distress on exam. Small indurated area to right inguinal crease with no extension into scrotum. I&D, no antibiotics indicated without evidence of cellulitis  See related encounter note  EMERGENCY DEPARTMENT US SOFT TISSUE INTERPRETATION "Study: Limited Soft Tissue Ultrasound"  INDICATIONS: Soft tissue infection Multiple views of the body part were obtained in real-time with a multi-frequency linear probe  PERFORMED BY: Myself IMAGES ARCHIVED?: Yes SIDE:Right  BODY PART:groin INTERPRETATION:  Abcess present and No cellulitis noted     Lyndal Pulleyaniel Gigi Onstad, MD 09/20/16 1729
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
## 2017-04-12 ENCOUNTER — Ambulatory Visit (INDEPENDENT_AMBULATORY_CARE_PROVIDER_SITE_OTHER): Payer: Self-pay | Admitting: Physician Assistant

## 2017-04-12 ENCOUNTER — Encounter (INDEPENDENT_AMBULATORY_CARE_PROVIDER_SITE_OTHER): Payer: Self-pay | Admitting: Physician Assistant

## 2017-04-12 VITALS — BP 118/78 | HR 68 | Temp 97.7°F | Wt 173.0 lb

## 2017-04-12 DIAGNOSIS — R109 Unspecified abdominal pain: Secondary | ICD-10-CM

## 2017-04-12 DIAGNOSIS — Q8901 Asplenia (congenital): Secondary | ICD-10-CM

## 2017-04-12 DIAGNOSIS — Z114 Encounter for screening for human immunodeficiency virus [HIV]: Secondary | ICD-10-CM

## 2017-04-12 LAB — POCT URINALYSIS DIPSTICK
BILIRUBIN UA: NEGATIVE
GLUCOSE UA: NEGATIVE
Leukocytes, UA: NEGATIVE
Nitrite, UA: NEGATIVE
RBC UA: NEGATIVE
SPEC GRAV UA: 1.025 (ref 1.010–1.025)
UROBILINOGEN UA: 1 U/dL
pH, UA: 6 (ref 5.0–8.0)

## 2017-04-12 MED ORDER — NAPROXEN 500 MG PO TABS: 500.0000 mg | ORAL_TABLET | Freq: Two times a day (BID) | ORAL | 0 refills | Status: DC

## 2017-04-12 NOTE — Patient Instructions (Signed)
Flank Pain, Adult Flank pain is pain that is located on the side of the body between the upper abdomen and the back. This area is called the flank. The pain may occur over a short period of time (acute), or it may be long-term or recurring (chronic). It may be mild or severe. Flank pain can be caused by many things, including:  Muscle soreness or injury.  Kidney stones or kidney disease.  Stress.  A disease of the spine (vertebral disk disease).  A lung infection (pneumonia).  Fluid around the lungs (pulmonary edema).  A skin rash caused by the chickenpox virus (shingles).  Tumors that affect the back of the abdomen.  Gallbladder disease.  Follow these instructions at home:  Drink enough fluid to keep your urine clear or pale yellow.  Rest as told by your health care provider.  Take over-the-counter and prescription medicines only as told by your health care provider.  Keep a journal to track what has caused your flank pain and what has made it feel better.  Keep all follow-up visits as told by your health care provider. This is important. Contact a health care provider if:  Your pain is not controlled with medicine.  You have new symptoms.  Your pain gets worse.  You have a fever.  Your symptoms last longer than 2-3 days.  You have trouble urinating or you are urinating very frequently. Get help right away if:  You have trouble breathing or you are short of breath.  Your abdomen hurts or it is swollen or red.  You have nausea or vomiting.  You feel faint or you pass out.  You have blood in your urine. Summary  Flank pain is pain that is located on the side of the body between the upper abdomen and the back.  The pain may occur over a short period of time (acute), or it may be long-term or recurring (chronic). It may be mild or severe.  Flank pain can be caused by many things.  Contact your health care provider if your symptoms get worse or they last  longer than 2-3 days. This information is not intended to replace advice given to you by your health care provider. Make sure you discuss any questions you have with your health care provider. Document Released: 08/24/2005 Document Revised: 09/15/2016 Document Reviewed: 09/15/2016 Elsevier Interactive Patient Education  2018 Elsevier Inc.  
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
## 2017-04-26 ENCOUNTER — Ambulatory Visit (INDEPENDENT_AMBULATORY_CARE_PROVIDER_SITE_OTHER): Payer: Self-pay | Admitting: Physician Assistant

## 2017-12-07 ENCOUNTER — Emergency Department (HOSPITAL_COMMUNITY)
Admission: EM | Admit: 2017-12-07 | Discharge: 2017-12-07 | Disposition: A | Discharge status: Home / Self Care | Payer: Self-pay | Attending: Emergency Medicine | Admitting: Emergency Medicine

## 2017-12-07 ENCOUNTER — Encounter (HOSPITAL_COMMUNITY): Payer: Self-pay | Admitting: Emergency Medicine

## 2017-12-07 ENCOUNTER — Other Ambulatory Visit: Payer: Self-pay

## 2017-12-07 DIAGNOSIS — Z87891 Personal history of nicotine dependence: Secondary | ICD-10-CM | POA: Insufficient documentation

## 2017-12-07 DIAGNOSIS — S0990XA Unspecified injury of head, initial encounter: Secondary | ICD-10-CM

## 2017-12-07 DIAGNOSIS — Y9259 Other trade areas as the place of occurrence of the external cause: Secondary | ICD-10-CM | POA: Insufficient documentation

## 2017-12-07 DIAGNOSIS — Z23 Encounter for immunization: Secondary | ICD-10-CM | POA: Insufficient documentation

## 2017-12-07 DIAGNOSIS — S0001XA Abrasion of scalp, initial encounter: Secondary | ICD-10-CM | POA: Insufficient documentation

## 2017-12-07 DIAGNOSIS — Y999 Unspecified external cause status: Secondary | ICD-10-CM | POA: Insufficient documentation

## 2017-12-07 DIAGNOSIS — R400 Somnolence: Secondary | ICD-10-CM | POA: Insufficient documentation

## 2017-12-07 DIAGNOSIS — Y939 Activity, unspecified: Secondary | ICD-10-CM | POA: Insufficient documentation

## 2017-12-07 MED ORDER — TETANUS-DIPHTH-ACELL PERTUSSIS 5-2.5-18.5 LF-MCG/0.5 IM SUSP
0.5000 mL | Freq: Once | INTRAMUSCULAR | Status: AC
  Administered 2017-12-07: 0.5 mL via INTRAMUSCULAR
  Filled 2017-12-07: qty 0.5

## 2017-12-07 NOTE — Discharge Instructions (Addendum)
Use a soap and water wash to your scalp wound twice daily and plan a recheck by your doctor or by returning here if you have any signs of infection (redness, worse swelling or drainage) from the wound site.  Refer to the minor head injury instructions and get rechecked if you develop any new symptoms.  As discussed, this this injury occurred yesterday, it is unlikely you will develop any of these new symptoms.
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
## 2018-09-02 ENCOUNTER — Emergency Department (HOSPITAL_BASED_OUTPATIENT_CLINIC_OR_DEPARTMENT_OTHER)
Admission: EM | Admit: 2018-09-02 | Discharge: 2018-09-03 | Disposition: A | Discharge status: Home / Self Care | Payer: No Typology Code available for payment source | Attending: Emergency Medicine | Admitting: Emergency Medicine

## 2018-09-02 ENCOUNTER — Other Ambulatory Visit: Payer: Self-pay

## 2018-09-02 ENCOUNTER — Encounter (HOSPITAL_BASED_OUTPATIENT_CLINIC_OR_DEPARTMENT_OTHER): Payer: Self-pay

## 2018-09-02 ENCOUNTER — Emergency Department (HOSPITAL_BASED_OUTPATIENT_CLINIC_OR_DEPARTMENT_OTHER): Payer: No Typology Code available for payment source

## 2018-09-02 DIAGNOSIS — Y999 Unspecified external cause status: Secondary | ICD-10-CM | POA: Insufficient documentation

## 2018-09-02 DIAGNOSIS — S161XXA Strain of muscle, fascia and tendon at neck level, initial encounter: Secondary | ICD-10-CM

## 2018-09-02 DIAGNOSIS — Z79899 Other long term (current) drug therapy: Secondary | ICD-10-CM | POA: Insufficient documentation

## 2018-09-02 DIAGNOSIS — Y9389 Activity, other specified: Secondary | ICD-10-CM | POA: Diagnosis not present

## 2018-09-02 DIAGNOSIS — S199XXA Unspecified injury of neck, initial encounter: Secondary | ICD-10-CM | POA: Diagnosis present

## 2018-09-02 DIAGNOSIS — S93602A Unspecified sprain of left foot, initial encounter: Secondary | ICD-10-CM

## 2018-09-02 DIAGNOSIS — F17210 Nicotine dependence, cigarettes, uncomplicated: Secondary | ICD-10-CM | POA: Diagnosis not present

## 2018-09-02 DIAGNOSIS — Y92410 Unspecified street and highway as the place of occurrence of the external cause: Secondary | ICD-10-CM | POA: Diagnosis not present

## 2018-09-02 DIAGNOSIS — R109 Unspecified abdominal pain: Secondary | ICD-10-CM

## 2018-09-02 NOTE — ED Notes (Signed)
ED Provider at bedside. 
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
## 2018-09-03 MED ORDER — NAPROXEN 500 MG PO TABS: ORAL_TABLET | ORAL | 0 refills | Status: DC

## 2018-09-03 MED ORDER — NAPROXEN 250 MG PO TABS
500.0000 mg | ORAL_TABLET | Freq: Once | ORAL | Status: AC
  Administered 2018-09-03: 500 mg via ORAL
  Filled 2018-09-03: qty 2

## 2019-01-01 ENCOUNTER — Ambulatory Visit (HOSPITAL_COMMUNITY)
Admission: EM | Admit: 2019-01-01 | Discharge: 2019-01-01 | Disposition: A | Discharge status: Home / Self Care | Payer: Self-pay | Attending: Internal Medicine | Admitting: Internal Medicine

## 2019-01-01 ENCOUNTER — Encounter (HOSPITAL_COMMUNITY): Payer: Self-pay | Admitting: Emergency Medicine

## 2019-01-01 ENCOUNTER — Other Ambulatory Visit: Payer: Self-pay

## 2019-01-01 ENCOUNTER — Ambulatory Visit (INDEPENDENT_AMBULATORY_CARE_PROVIDER_SITE_OTHER): Payer: Self-pay

## 2019-01-01 DIAGNOSIS — S6991XA Unspecified injury of right wrist, hand and finger(s), initial encounter: Secondary | ICD-10-CM

## 2019-01-01 DIAGNOSIS — S62346A Nondisplaced fracture of base of fifth metacarpal bone, right hand, initial encounter for closed fracture: Secondary | ICD-10-CM

## 2019-01-01 DIAGNOSIS — W228XXA Striking against or struck by other objects, initial encounter: Secondary | ICD-10-CM

## 2019-01-01 NOTE — Progress Notes (Signed)
Orthopedic Tech Progress Note Patient Details:  Derrick [name] June 25, 1991 [id number]  Ortho Devices Type of Ortho Device: Arm sling, Ulna gutter splint Ortho Device/Splint Location: URL Ortho Device/Splint Interventions: Adjustment, Application, Ordered   Post Interventions Patient Tolerated: Well Instructions Provided: Care of device, Adjustment of device   Janit Pagan 01/01/2019, 4:13 PM

## 2019-01-01 NOTE — Discharge Instructions (Signed)
You do have a fracture in your hand.  We will place you in a splint Rest, ice and elevate.  You need to follow-up with orthopedic within the next week.
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
## 2019-11-14 ENCOUNTER — Other Ambulatory Visit: Payer: Self-pay

## 2019-11-14 ENCOUNTER — Emergency Department (HOSPITAL_COMMUNITY)
Admission: EM | Admit: 2019-11-14 | Discharge: 2019-11-14 | Disposition: A | Discharge status: Home / Self Care | Payer: Self-pay | Attending: Emergency Medicine | Admitting: Emergency Medicine

## 2019-11-14 ENCOUNTER — Encounter (HOSPITAL_COMMUNITY): Payer: Self-pay | Admitting: Emergency Medicine

## 2019-11-14 DIAGNOSIS — F17210 Nicotine dependence, cigarettes, uncomplicated: Secondary | ICD-10-CM | POA: Insufficient documentation

## 2019-11-14 DIAGNOSIS — R112 Nausea with vomiting, unspecified: Secondary | ICD-10-CM | POA: Insufficient documentation

## 2019-11-14 DIAGNOSIS — R197 Diarrhea, unspecified: Secondary | ICD-10-CM | POA: Insufficient documentation

## 2019-11-14 LAB — COMPREHENSIVE METABOLIC PANEL
ALT: 35 U/L (ref 0–44)
AST: 30 U/L (ref 15–41)
Albumin: 4.6 g/dL (ref 3.5–5.0)
Alkaline Phosphatase: 58 U/L (ref 38–126)
Anion gap: 13 (ref 5–15)
BUN: 15 mg/dL (ref 6–20)
CO2: 23 mmol/L (ref 22–32)
Calcium: 9.6 mg/dL (ref 8.9–10.3)
Chloride: 103 mmol/L (ref 98–111)
Creatinine, Ser: 1.14 mg/dL (ref 0.61–1.24)
GFR calc Af Amer: >60 mL/min (ref >60)
GFR calc non Af Amer: >60 mL/min (ref >60)
Glucose, Bld: 104 mg/dL — ABNORMAL HIGH (ref 70–99)
Potassium: 4.1 mmol/L (ref 3.5–5.1)
Sodium: 139 mmol/L (ref 135–145)
Total Bilirubin: 1.1 mg/dL (ref 0.3–1.2)
Total Protein: 7.1 g/dL (ref 6.5–8.1)

## 2019-11-14 LAB — URINALYSIS, ROUTINE W REFLEX MICROSCOPIC
Bilirubin Urine: NEGATIVE
Glucose, UA: NEGATIVE mg/dL
Hgb urine dipstick: NEGATIVE
Ketones, ur: NEGATIVE mg/dL
Leukocytes,Ua: NEGATIVE
Nitrite: NEGATIVE
Protein, ur: NEGATIVE mg/dL
Specific Gravity, Urine: 1.02 (ref 1.005–1.030)
pH: 6 (ref 5.0–8.0)

## 2019-11-14 LAB — CBC
HCT: 46.8 % (ref 39.0–52.0)
Hemoglobin: 15.6 g/dL (ref 13.0–17.0)
MCH: 33.8 pg (ref 26.0–34.0)
MCHC: 33.3 g/dL (ref 30.0–36.0)
MCV: 101.5 fL — ABNORMAL HIGH (ref 80.0–100.0)
Platelets: 316 10*3/uL (ref 150–400)
RBC: 4.61 MIL/uL (ref 4.22–5.81)
RDW: 13 % (ref 11.5–15.5)
WBC: 8.1 10*3/uL (ref 4.0–10.5)
nRBC: 0 % (ref 0.0–0.2)

## 2019-11-14 LAB — LIPASE, BLOOD: Lipase: 29 U/L (ref 11–51)

## 2019-11-14 MED ORDER — SODIUM CHLORIDE 0.9% FLUSH: 3.0000 mL | Freq: Once | INTRAVENOUS | Status: DC

## 2019-11-14 MED ORDER — ONDANSETRON 4 MG PO TBDP
4.0000 mg | ORAL_TABLET | Freq: Once | ORAL | Status: AC
  Administered 2019-11-14: 16:00:00 4 mg via ORAL
  Filled 2019-11-14: qty 1

## 2019-11-14 NOTE — ED Triage Notes (Signed)
Pt reports diarrhea and vomiting that began today, reports 2 episodes. Nad.

## 2019-11-14 NOTE — ED Notes (Signed)
Patient verbalizes understanding of discharge instructions. Opportunity for questioning and answers were provided. Armband removed by staff, pt discharged from ED to home in pov

## 2019-11-14 NOTE — Discharge Instructions (Addendum)
Your laboratory results are within normal limits. You may continue to hydrate with plenty fluids.    You experience any fever, worsening symptoms you may return to the emergency department.
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
## 2020-04-05 ENCOUNTER — Encounter (HOSPITAL_COMMUNITY): Payer: Self-pay | Admitting: Emergency Medicine

## 2020-04-05 ENCOUNTER — Emergency Department (HOSPITAL_COMMUNITY)
Admission: EM | Admit: 2020-04-05 | Discharge: 2020-04-06 | Disposition: A | Discharge status: Home / Self Care | Payer: Self-pay | Attending: Emergency Medicine | Admitting: Emergency Medicine

## 2020-04-05 ENCOUNTER — Other Ambulatory Visit: Payer: Self-pay

## 2020-04-05 DIAGNOSIS — Z5321 Procedure and treatment not carried out due to patient leaving prior to being seen by health care provider: Secondary | ICD-10-CM | POA: Insufficient documentation

## 2020-04-05 DIAGNOSIS — L02214 Cutaneous abscess of groin: Secondary | ICD-10-CM | POA: Insufficient documentation

## 2020-04-05 DIAGNOSIS — L02411 Cutaneous abscess of right axilla: Secondary | ICD-10-CM | POA: Insufficient documentation

## 2020-04-05 NOTE — ED Triage Notes (Signed)
Pt states he has abscess on his right armpit and groin, needs to be check.

## 2020-04-06 ENCOUNTER — Ambulatory Visit (HOSPITAL_COMMUNITY)
Admission: EM | Admit: 2020-04-06 | Discharge: 2020-04-06 | Disposition: A | Discharge status: Home / Self Care | Payer: Self-pay | Attending: Internal Medicine | Admitting: Internal Medicine

## 2020-04-06 ENCOUNTER — Other Ambulatory Visit: Payer: Self-pay

## 2020-04-06 ENCOUNTER — Encounter (HOSPITAL_COMMUNITY): Payer: Self-pay

## 2020-04-06 DIAGNOSIS — I808 Phlebitis and thrombophlebitis of other sites: Secondary | ICD-10-CM

## 2020-04-06 DIAGNOSIS — L02411 Cutaneous abscess of right axilla: Secondary | ICD-10-CM

## 2020-04-06 MED ORDER — SULFAMETHOXAZOLE-TRIMETHOPRIM 800-160 MG PO TABS: 1.0000 | ORAL_TABLET | Freq: Two times a day (BID) | ORAL | 0 refills | Status: AC

## 2020-04-06 MED ORDER — IBUPROFEN 600 MG PO TABS
600.0000 mg | ORAL_TABLET | Freq: Four times a day (QID) | ORAL | 0 refills | Status: DC | PRN
Start: 2020-04-06 — End: 2021-12-26

## 2020-04-06 NOTE — Discharge Instructions (Addendum)
Warm compress over the right upper arm in the tender area If symptoms worsen please return to urgent care to be evaluated
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
## 2020-04-06 NOTE — ED Notes (Signed)
Pt states he is leaving °

## 2020-04-06 NOTE — ED Triage Notes (Signed)
Pt c/o abscess in right axillax1 yr.

## 2021-12-26 ENCOUNTER — Other Ambulatory Visit: Payer: Self-pay

## 2021-12-26 ENCOUNTER — Encounter (HOSPITAL_COMMUNITY): Payer: Self-pay

## 2021-12-26 ENCOUNTER — Emergency Department (HOSPITAL_COMMUNITY): Payer: Self-pay

## 2021-12-26 ENCOUNTER — Inpatient Hospital Stay (HOSPITAL_COMMUNITY)
Admission: EM | Admit: 2021-12-26 | Discharge: 2021-12-30 | DRG: 580 | Disposition: A | Discharge status: Home / Self Care | Payer: Self-pay | Attending: Student | Admitting: Student

## 2021-12-26 DIAGNOSIS — M009 Pyogenic arthritis, unspecified: Secondary | ICD-10-CM | POA: Diagnosis present

## 2021-12-26 DIAGNOSIS — Z720 Tobacco use: Secondary | ICD-10-CM | POA: Diagnosis present

## 2021-12-26 DIAGNOSIS — L02511 Cutaneous abscess of right hand: Principal | ICD-10-CM | POA: Diagnosis present

## 2021-12-26 DIAGNOSIS — W2209XA Striking against other stationary object, initial encounter: Secondary | ICD-10-CM | POA: Diagnosis present

## 2021-12-26 DIAGNOSIS — L03113 Cellulitis of right upper limb: Secondary | ICD-10-CM | POA: Diagnosis present

## 2021-12-26 DIAGNOSIS — D72825 Bandemia: Secondary | ICD-10-CM | POA: Diagnosis present

## 2021-12-26 DIAGNOSIS — Z6830 Body mass index (BMI) 30.0-30.9, adult: Secondary | ICD-10-CM

## 2021-12-26 DIAGNOSIS — L02519 Cutaneous abscess of unspecified hand: Principal | ICD-10-CM

## 2021-12-26 DIAGNOSIS — L089 Local infection of the skin and subcutaneous tissue, unspecified: Secondary | ICD-10-CM | POA: Diagnosis present

## 2021-12-26 DIAGNOSIS — Z886 Allergy status to analgesic agent status: Secondary | ICD-10-CM

## 2021-12-26 DIAGNOSIS — D72820 Lymphocytosis (symptomatic): Secondary | ICD-10-CM | POA: Diagnosis present

## 2021-12-26 DIAGNOSIS — E669 Obesity, unspecified: Secondary | ICD-10-CM | POA: Diagnosis present

## 2021-12-26 DIAGNOSIS — F17210 Nicotine dependence, cigarettes, uncomplicated: Secondary | ICD-10-CM | POA: Diagnosis present

## 2021-12-26 LAB — CBC WITH DIFFERENTIAL/PLATELET
Abs Immature Granulocytes: 0.06 10*3/uL (ref 0.00–0.07)
Basophils Absolute: 0.1 10*3/uL (ref 0.0–0.1)
Basophils Relative: 1 %
Eosinophils Absolute: 0.3 10*3/uL (ref 0.0–0.5)
Eosinophils Relative: 2 %
HCT: 45.2 % (ref 39.0–52.0)
Hemoglobin: 16 g/dL (ref 13.0–17.0)
Immature Granulocytes: 0 %
Lymphocytes Relative: 37 %
Lymphs Abs: 5.6 10*3/uL — ABNORMAL HIGH (ref 0.7–4.0)
MCH: 35 pg — ABNORMAL HIGH (ref 26.0–34.0)
MCHC: 35.4 g/dL (ref 30.0–36.0)
MCV: 98.9 fL (ref 80.0–100.0)
Monocytes Absolute: 1.4 10*3/uL — ABNORMAL HIGH (ref 0.1–1.0)
Monocytes Relative: 9 %
Neutro Abs: 7.8 10*3/uL — ABNORMAL HIGH (ref 1.7–7.7)
Neutrophils Relative %: 51 %
Platelets: 272 10*3/uL (ref 150–400)
RBC: 4.57 MIL/uL (ref 4.22–5.81)
RDW: 13.1 % (ref 11.5–15.5)
WBC: 15.2 10*3/uL — ABNORMAL HIGH (ref 4.0–10.5)
nRBC: 0 % (ref 0.0–0.2)

## 2021-12-26 LAB — COMPREHENSIVE METABOLIC PANEL
ALT: 27 U/L (ref 0–44)
AST: 29 U/L (ref 15–41)
Albumin: 4.2 g/dL (ref 3.5–5.0)
Alkaline Phosphatase: 56 U/L (ref 38–126)
Anion gap: 12 (ref 5–15)
BUN: 18 mg/dL (ref 6–20)
CO2: 19 mmol/L — ABNORMAL LOW (ref 22–32)
Calcium: 9.3 mg/dL (ref 8.9–10.3)
Chloride: 105 mmol/L (ref 98–111)
Creatinine, Ser: 1.18 mg/dL (ref 0.61–1.24)
GFR, Estimated: >60 mL/min (ref >60)
Glucose, Bld: 88 mg/dL (ref 70–99)
Potassium: 4.1 mmol/L (ref 3.5–5.1)
Sodium: 136 mmol/L (ref 135–145)
Total Bilirubin: 1.2 mg/dL (ref 0.3–1.2)
Total Protein: 6.9 g/dL (ref 6.5–8.1)

## 2021-12-26 MED ORDER — ACETAMINOPHEN 650 MG RE SUPP: 650.0000 mg | Freq: Four times a day (QID) | RECTAL | Status: DC | PRN

## 2021-12-26 MED ORDER — HYDROCODONE-ACETAMINOPHEN 5-325 MG PO TABS
1.0000 | ORAL_TABLET | ORAL | Status: DC | PRN
  Administered 2021-12-27 – 2021-12-30 (×8): 2 via ORAL
  Filled 2021-12-26 (×9): qty 2

## 2021-12-26 MED ORDER — ACETAMINOPHEN 325 MG PO TABS: 650.0000 mg | ORAL_TABLET | Freq: Four times a day (QID) | ORAL | Status: DC | PRN

## 2021-12-26 MED ORDER — SENNOSIDES-DOCUSATE SODIUM 8.6-50 MG PO TABS: 1.0000 | ORAL_TABLET | Freq: Every evening | ORAL | Status: DC | PRN

## 2021-12-26 MED ORDER — VANCOMYCIN HCL 1250 MG/250ML IV SOLN
1250.0000 mg | Freq: Two times a day (BID) | INTRAVENOUS | Status: DC
  Administered 2021-12-27 – 2021-12-29 (×5): 1250 mg via INTRAVENOUS
  Filled 2021-12-26 (×6): qty 250

## 2021-12-26 MED ORDER — ONDANSETRON HCL 4 MG/2ML IJ SOLN: 4.0000 mg | Freq: Four times a day (QID) | INTRAMUSCULAR | Status: DC | PRN

## 2021-12-26 MED ORDER — MORPHINE SULFATE (PF) 4 MG/ML IV SOLN
4.0000 mg | Freq: Once | INTRAVENOUS | Status: AC
  Administered 2021-12-26: 4 mg via INTRAVENOUS
  Filled 2021-12-26: qty 1

## 2021-12-26 MED ORDER — CEFAZOLIN SODIUM-DEXTROSE 2-4 GM/100ML-% IV SOLN
2.0000 g | Freq: Once | INTRAVENOUS | Status: AC
  Administered 2021-12-26: 2 g via INTRAVENOUS
  Filled 2021-12-26: qty 100

## 2021-12-26 MED ORDER — CEFAZOLIN SODIUM-DEXTROSE 1-4 GM/50ML-% IV SOLN
1.0000 g | Freq: Three times a day (TID) | INTRAVENOUS | Status: DC
  Administered 2021-12-27 (×2): 1 g via INTRAVENOUS
  Filled 2021-12-26 (×2): qty 50

## 2021-12-26 MED ORDER — VANCOMYCIN HCL IN DEXTROSE 1-5 GM/200ML-% IV SOLN
1000.0000 mg | Freq: Once | INTRAVENOUS | Status: AC
  Administered 2021-12-26: 1000 mg via INTRAVENOUS
  Filled 2021-12-26: qty 200

## 2021-12-26 MED ORDER — MORPHINE SULFATE (PF) 2 MG/ML IV SOLN
1.0000 mg | INTRAVENOUS | Status: DC | PRN
  Administered 2021-12-27: 1 mg via INTRAVENOUS
  Filled 2021-12-26: qty 1

## 2021-12-26 MED ORDER — ONDANSETRON HCL 4 MG PO TABS: 4.0000 mg | ORAL_TABLET | Freq: Four times a day (QID) | ORAL | Status: DC | PRN
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
## 2021-12-26 NOTE — Progress Notes (Signed)
Pharmacy Antibiotic Note  Derrick [name] is a 31 y.o. male for which pharmacy has been consulted for vancomycin dosing for  wound infection . Patient presenting with swelling and purulence to hand following punching a wall.  SCr 1.18 - at baseline WBC 15.2; T 98.6 F; HR 61; RR 16  Plan: Cefazolin 1g q8h Vancomycin 1000 mg given once in ED. Will start 1250 mg q12hr ([id number]) unless change in renal function Trend WBC, Fever, Renal function, & Clinical course F/u cultures, clinical course, WBC, fever De-escalate when able  Height: 5\' 7"  (170.2 cm) Weight: 88.5 kg (195 lb) IBW/kg (Calculated) : 66.1  Temp (24hrs), Avg:98.7 F (37.1 C), Min:98.6 F (37 C), Max:98.7 F (37.1 C)  Recent Labs  Lab 12/26/21 2120  WBC 15.2*  CREATININE 1.18    Estimated Creatinine Clearance: 96.4 mL/min (by C-G formula based on SCr of 1.18 mg/dL).    Allergies  Allergen Reactions   Ibuprofen     Body aches    Antimicrobials this admission: cefazolin 6/12 >>  vancomycin 6/12 >>  Microbiology results: Pending  Thank you for allowing pharmacy to be a part of this patient's care.  Lorelei Pont, PharmD, BCPS 12/26/2021 11:09 PM ED Clinical Pharmacist -  [phone]
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
## 2021-12-26 NOTE — Hospital Course (Signed)
ANDERW CHO is a 31 y.o. male with no known significant medical history who is admitted with infection to the right hand after traumatic injury.  Started on IV antibiotics.  Hand surgery to evaluate for potential I&D.

## 2021-12-26 NOTE — Assessment & Plan Note (Signed)
Reports smoking half a pack a day.  He declines nicotine patch.
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
## 2021-12-26 NOTE — ED Provider Triage Note (Signed)
Emergency Medicine Provider Triage Evaluation Note  Derrick [name] , a 31 y.o. male  was evaluated in triage.  Pt complains of bilateral hand pain after punching a wall with both hands yesterday.  Woke up this morning and they were much more painful.  Not on anticoagulation.  Denies numbness or tingling.  Review of Systems  Positive:  Negative: As above  Physical Exam  BP (!) 152/95 (BP Location: Left Arm)   Pulse 61   Temp 98.6 F (37 C) (Oral)   Resp 16   Ht 5\' 7"  (1.702 m)   Wt 88.5 kg   SpO2 99%   BMI 30.54 kg/m  Gen:   Awake, no distress   Resp:  Normal effort  MSK:   Tenderness and swelling of both hands with mildly reduced range of motion of all digits.  Compartments soft.  Radial pulses 2+ bilaterally.  Appear neurovascularly intact. Other:    Medical Decision Making  Medically screening exam initiated at 3:49 PM.  Appropriate orders placed.  Derrick [name] was informed that the remainder of the evaluation will be completed by another provider, this initial triage assessment does not replace that evaluation, and the importance of remaining in the ED until their evaluation is complete.     , PA-C 12/26/21 1551

## 2021-12-27 ENCOUNTER — Observation Stay (HOSPITAL_COMMUNITY): Payer: Self-pay | Admitting: Certified Registered Nurse Anesthetist

## 2021-12-27 ENCOUNTER — Encounter (HOSPITAL_COMMUNITY): Payer: Self-pay | Admitting: Internal Medicine

## 2021-12-27 ENCOUNTER — Encounter (HOSPITAL_COMMUNITY)
Admission: EM | Disposition: A | Discharge status: Home / Self Care | Payer: Self-pay | Source: Home / Self Care | Attending: Student

## 2021-12-27 ENCOUNTER — Other Ambulatory Visit: Payer: Self-pay

## 2021-12-27 ENCOUNTER — Observation Stay (HOSPITAL_BASED_OUTPATIENT_CLINIC_OR_DEPARTMENT_OTHER): Payer: Self-pay | Admitting: Certified Registered Nurse Anesthetist

## 2021-12-27 DIAGNOSIS — D72820 Lymphocytosis (symptomatic): Secondary | ICD-10-CM

## 2021-12-27 DIAGNOSIS — Z720 Tobacco use: Secondary | ICD-10-CM

## 2021-12-27 DIAGNOSIS — D72825 Bandemia: Secondary | ICD-10-CM | POA: Diagnosis present

## 2021-12-27 DIAGNOSIS — M009 Pyogenic arthritis, unspecified: Secondary | ICD-10-CM

## 2021-12-27 DIAGNOSIS — E669 Obesity, unspecified: Secondary | ICD-10-CM

## 2021-12-27 DIAGNOSIS — S66324A Laceration of extensor muscle, fascia and tendon of right ring finger at wrist and hand level, initial encounter: Secondary | ICD-10-CM

## 2021-12-27 HISTORY — PX: I & D EXTREMITY: SHX5045

## 2021-12-27 LAB — CBC
HCT: 44.8 % (ref 39.0–52.0)
Hemoglobin: 15.8 g/dL (ref 13.0–17.0)
MCH: 34.3 pg — ABNORMAL HIGH (ref 26.0–34.0)
MCHC: 35.3 g/dL (ref 30.0–36.0)
MCV: 97.4 fL (ref 80.0–100.0)
Platelets: 267 10*3/uL (ref 150–400)
RBC: 4.6 MIL/uL (ref 4.22–5.81)
RDW: 12.9 % (ref 11.5–15.5)
WBC: 12.9 10*3/uL — ABNORMAL HIGH (ref 4.0–10.5)
nRBC: 0 % (ref 0.0–0.2)

## 2021-12-27 LAB — SURGICAL PCR SCREEN
MRSA, PCR: NEGATIVE
Staphylococcus aureus: NEGATIVE

## 2021-12-27 LAB — BASIC METABOLIC PANEL
Anion gap: 11 (ref 5–15)
BUN: 14 mg/dL (ref 6–20)
CO2: 22 mmol/L (ref 22–32)
Calcium: 8.9 mg/dL (ref 8.9–10.3)
Chloride: 102 mmol/L (ref 98–111)
Creatinine, Ser: 1.12 mg/dL (ref 0.61–1.24)
GFR, Estimated: >60 mL/min (ref >60)
Glucose, Bld: 104 mg/dL — ABNORMAL HIGH (ref 70–99)
Potassium: 4 mmol/L (ref 3.5–5.1)
Sodium: 135 mmol/L (ref 135–145)

## 2021-12-27 LAB — HIV ANTIBODY (ROUTINE TESTING W REFLEX): HIV Screen 4th Generation wRfx: NONREACTIVE

## 2021-12-27 SURGERY — IRRIGATION AND DEBRIDEMENT EXTREMITY
Anesthesia: Choice | Site: Hand | Laterality: Right

## 2021-12-27 MED ORDER — PROPOFOL 10 MG/ML IV BOLUS
INTRAVENOUS | Status: DC | PRN
  Administered 2021-12-27: 200 mg via INTRAVENOUS

## 2021-12-27 MED ORDER — CHLORHEXIDINE GLUCONATE 0.12 % MT SOLN
15.0000 mL | Freq: Once | OROMUCOSAL | Status: AC
Start: 2021-12-27 — End: 2021-12-27

## 2021-12-27 MED ORDER — MUPIROCIN 2 % EX OINT
1.0000 "application " | TOPICAL_OINTMENT | Freq: Two times a day (BID) | CUTANEOUS | Status: DC
  Administered 2021-12-27 – 2021-12-30 (×6): 1 via NASAL
  Filled 2021-12-27 (×2): qty 22

## 2021-12-27 MED ORDER — MIDAZOLAM HCL 2 MG/2ML IJ SOLN
INTRAMUSCULAR | Status: DC | PRN
  Administered 2021-12-27: 2 mg via INTRAVENOUS

## 2021-12-27 MED ORDER — LACTATED RINGERS IV SOLN: INTRAVENOUS | Status: DC

## 2021-12-27 MED ORDER — ORAL CARE MOUTH RINSE: 15.0000 mL | Freq: Once | OROMUCOSAL | Status: AC

## 2021-12-27 MED ORDER — ONDANSETRON HCL 4 MG/2ML IJ SOLN
INTRAMUSCULAR | Status: AC
Start: 2021-12-27 — End: ?
  Filled 2021-12-27: qty 4

## 2021-12-27 MED ORDER — DEXAMETHASONE SODIUM PHOSPHATE 10 MG/ML IJ SOLN
INTRAMUSCULAR | Status: DC | PRN
  Administered 2021-12-27: 5 mg

## 2021-12-27 MED ORDER — BUPIVACAINE HCL (PF) 0.5 % IJ SOLN
INTRAMUSCULAR | Status: DC | PRN
  Administered 2021-12-27: 30 mL via PERINEURAL

## 2021-12-27 MED ORDER — ONDANSETRON HCL 4 MG/2ML IJ SOLN
INTRAMUSCULAR | Status: DC | PRN
  Administered 2021-12-27: 4 mg via INTRAVENOUS

## 2021-12-27 MED ORDER — FENTANYL CITRATE (PF) 100 MCG/2ML IJ SOLN
INTRAMUSCULAR | Status: AC
  Filled 2021-12-27: qty 2

## 2021-12-27 MED ORDER — MIDAZOLAM HCL 2 MG/2ML IJ SOLN
INTRAMUSCULAR | Status: AC
  Filled 2021-12-27: qty 2

## 2021-12-27 MED ORDER — ROCURONIUM BROMIDE 10 MG/ML (PF) SYRINGE
PREFILLED_SYRINGE | INTRAVENOUS | Status: AC
  Filled 2021-12-27: qty 10

## 2021-12-27 MED ORDER — DEXMEDETOMIDINE (PRECEDEX) IN NS 20 MCG/5ML (4 MCG/ML) IV SYRINGE
PREFILLED_SYRINGE | INTRAVENOUS | Status: DC | PRN
  Administered 2021-12-27: 40 ug via INTRAVENOUS

## 2021-12-27 MED ORDER — CHLORHEXIDINE GLUCONATE 0.12 % MT SOLN
OROMUCOSAL | Status: AC
  Administered 2021-12-27: 15 mL via OROMUCOSAL
  Filled 2021-12-27: qty 15

## 2021-12-27 MED ORDER — DEXAMETHASONE SODIUM PHOSPHATE 10 MG/ML IJ SOLN
INTRAMUSCULAR | Status: AC
  Filled 2021-12-27: qty 1

## 2021-12-27 MED ORDER — DEXMEDETOMIDINE HCL IN NACL 80 MCG/20ML IV SOLN
INTRAVENOUS | Status: AC
  Filled 2021-12-27: qty 20

## 2021-12-27 MED ORDER — FENTANYL CITRATE (PF) 250 MCG/5ML IJ SOLN
INTRAMUSCULAR | Status: DC | PRN
Start: 2021-12-27 — End: 2021-12-27
  Administered 2021-12-27: 100 ug via INTRAVENOUS

## 2021-12-27 MED ORDER — SODIUM CHLORIDE 0.9 % IR SOLN
Status: DC | PRN
  Administered 2021-12-27: 3000 mL

## 2021-12-27 MED ORDER — PROPOFOL 10 MG/ML IV BOLUS
INTRAVENOUS | Status: AC
  Filled 2021-12-27: qty 20

## 2021-12-27 MED ORDER — SODIUM CHLORIDE 0.9 % IV SOLN
3.0000 g | Freq: Four times a day (QID) | INTRAVENOUS | Status: DC
  Administered 2021-12-27 – 2021-12-28 (×5): 3 g via INTRAVENOUS
  Filled 2021-12-27 (×5): qty 8

## 2021-12-27 MED ORDER — 0.9 % SODIUM CHLORIDE (POUR BTL) OPTIME
TOPICAL | Status: DC | PRN
  Administered 2021-12-27: 1000 mL

## 2021-12-27 MED ORDER — LIDOCAINE 2% (20 MG/ML) 5 ML SYRINGE
INTRAMUSCULAR | Status: AC
  Filled 2021-12-27: qty 5

## 2021-12-27 SURGICAL SUPPLY — 58 items
BAG COUNTER SPONGE SURGICOUNT (BAG) ×2 IMPLANT
BNDG COHESIVE 1X5 TAN STRL LF (GAUZE/BANDAGES/DRESSINGS) IMPLANT
BNDG CONFORM 2 STRL LF (GAUZE/BANDAGES/DRESSINGS) IMPLANT
BNDG ELASTIC 3X5.8 VLCR STR LF (GAUZE/BANDAGES/DRESSINGS) ×2 IMPLANT
BNDG ELASTIC 4X5.8 VLCR STR LF (GAUZE/BANDAGES/DRESSINGS) ×2 IMPLANT
BNDG ESMARK 4X9 LF (GAUZE/BANDAGES/DRESSINGS) ×2 IMPLANT
BNDG GAUZE ELAST 4 BULKY (GAUZE/BANDAGES/DRESSINGS) ×2 IMPLANT
CORD BIPOLAR FORCEPS 12FT (ELECTRODE) ×2 IMPLANT
COVER SURGICAL LIGHT HANDLE (MISCELLANEOUS) ×2 IMPLANT
CUFF TOURN SGL QUICK 18X4 (TOURNIQUET CUFF) ×2 IMPLANT
CUFF TOURN SGL QUICK 24 (TOURNIQUET CUFF)
CUFF TRNQT CYL 24X4X16.5-23 (TOURNIQUET CUFF) IMPLANT
DRAIN PENROSE 1/4X12 LTX STRL (WOUND CARE) IMPLANT
DRAPE SURG 17X23 STRL (DRAPES) ×2 IMPLANT
DRSG ADAPTIC 3X8 NADH LF (GAUZE/BANDAGES/DRESSINGS) ×2 IMPLANT
ELECT REM PT RETURN 9FT ADLT (ELECTROSURGICAL)
ELECTRODE REM PT RTRN 9FT ADLT (ELECTROSURGICAL) IMPLANT
GAUZE SPONGE 4X4 12PLY STRL (GAUZE/BANDAGES/DRESSINGS) ×2 IMPLANT
GAUZE XEROFORM 1X8 LF (GAUZE/BANDAGES/DRESSINGS) ×2 IMPLANT
GAUZE XEROFORM 5X9 LF (GAUZE/BANDAGES/DRESSINGS) IMPLANT
GLOVE BIO SURGEON STRL SZ7.5 (GLOVE) ×2 IMPLANT
GLOVE BIOGEL PI IND STRL 8.5 (GLOVE) ×1 IMPLANT
GLOVE BIOGEL PI INDICATOR 8.5 (GLOVE) ×1
GLOVE SURG ORTHO 8.0 STRL STRW (GLOVE) ×2 IMPLANT
GLOVE SURG UNDER POLY LF SZ7.5 (GLOVE) ×4 IMPLANT
GOWN STRL REUS W/ TWL LRG LVL3 (GOWN DISPOSABLE) ×3 IMPLANT
GOWN STRL REUS W/ TWL XL LVL3 (GOWN DISPOSABLE) ×1 IMPLANT
GOWN STRL REUS W/TWL LRG LVL3 (GOWN DISPOSABLE) ×6
GOWN STRL REUS W/TWL XL LVL3 (GOWN DISPOSABLE) ×2
HANDPIECE INTERPULSE COAX TIP (DISPOSABLE)
KIT BASIN OR (CUSTOM PROCEDURE TRAY) ×2 IMPLANT
KIT TURNOVER KIT B (KITS) ×2 IMPLANT
MANIFOLD NEPTUNE II (INSTRUMENTS) ×2 IMPLANT
NDL HYPO 25GX1X1/2 BEV (NEEDLE) IMPLANT
NEEDLE HYPO 25GX1X1/2 BEV (NEEDLE) IMPLANT
NS IRRIG 1000ML POUR BTL (IV SOLUTION) ×2 IMPLANT
PACK ORTHO EXTREMITY (CUSTOM PROCEDURE TRAY) ×2 IMPLANT
PAD ARMBOARD 7.5X6 YLW CONV (MISCELLANEOUS) ×4 IMPLANT
PAD CAST 4YDX4 CTTN HI CHSV (CAST SUPPLIES) ×1 IMPLANT
PADDING CAST COTTON 4X4 STRL (CAST SUPPLIES) ×2
SET CYSTO W/LG BORE CLAMP LF (SET/KITS/TRAYS/PACK) ×1 IMPLANT
SET HNDPC FAN SPRY TIP SCT (DISPOSABLE) IMPLANT
SOAP 2 % CHG 4 OZ (WOUND CARE) ×2 IMPLANT
SPONGE T-LAP 18X18 ~~LOC~~+RFID (SPONGE) ×1 IMPLANT
SPONGE T-LAP 4X18 ~~LOC~~+RFID (SPONGE) ×1 IMPLANT
SUT ETHILON 4 0 PS 2 18 (SUTURE) ×1 IMPLANT
SUT ETHILON 5 0 P 3 18 (SUTURE)
SUT NYLON ETHILON 5-0 P-3 1X18 (SUTURE) IMPLANT
SUT PDS AB 4-0 SH 27 (SUTURE) ×1 IMPLANT
SWAB COLLECTION DEVICE MRSA (MISCELLANEOUS) ×2 IMPLANT
SWAB CULTURE ESWAB REG 1ML (MISCELLANEOUS) ×1 IMPLANT
SYR CONTROL 10ML LL (SYRINGE) IMPLANT
TOWEL GREEN STERILE (TOWEL DISPOSABLE) ×2 IMPLANT
TOWEL GREEN STERILE FF (TOWEL DISPOSABLE) ×2 IMPLANT
TUBE CONNECTING 12X1/4 (SUCTIONS) ×2 IMPLANT
UNDERPAD 30X36 HEAVY ABSORB (UNDERPADS AND DIAPERS) ×2 IMPLANT
WATER STERILE IRR 1000ML POUR (IV SOLUTION) ×2 IMPLANT
YANKAUER SUCT BULB TIP NO VENT (SUCTIONS) ×2 IMPLANT

## 2021-12-27 NOTE — Anesthesia Procedure Notes (Signed)
Anesthesia Regional Block: Interscalene brachial plexus block   Pre-Anesthetic Checklist: , timeout performed,  Correct Patient, Correct Site, Correct Laterality,  Correct Procedure, Correct Position, site marked,  Risks and benefits discussed,  Surgical consent,  Pre-op evaluation,  At surgeon's request and post-op pain management  Laterality: Right  Prep: chloraprep       Needles:  Injection technique: Single-shot  Needle Type: Echogenic Stimulator Needle     Needle Length: 5cm  Needle Gauge: 22     Additional Needles:   Narrative:  Start time: 12/27/2021 10:53 AM End time: 12/27/2021 11:03 AM Injection made incrementally with aspirations every 5 mL.  Performed by: Personally  Anesthesiologist: Heather Roberts, MD  Additional Notes: Functioning IV was confirmed and monitors applied.  A 54mm 22ga echogenic arrow stimulator was used. Sterile prep and drape,hand hygiene and sterile gloves were used.Ultrasound guidance: relevant anatomy identified, needle position confirmed, local anesthetic spread visualized around nerve(s)., vascular puncture avoided.  Image printed for medical record.  Negative aspiration and negative test dose prior to incremental administration of local anesthetic. The patient tolerated the procedure well.

## 2021-12-27 NOTE — Progress Notes (Signed)
Mobility Specialist Progress Note   12/27/21 1459  Mobility  Activity Ambulated with assistance to bathroom  Level of Assistance Contact guard assist, steadying assist  Assistive Device  (HHA)  Distance Ambulated (ft) 20 ft  Activity Response Tolerated well  $Mobility charge 1 Mobility   Received pt in bed requesting assistance to the BR. Min G for bed mobility and initial stand but supervision for remainder of tx. Successful void. Returned back to bed w/o any faults or complaints, partner present in the room.   Frederico Hamman Mobility Specialist Phone Number [phone]

## 2021-12-27 NOTE — Plan of Care (Signed)

## 2021-12-27 NOTE — Anesthesia Postprocedure Evaluation (Signed)
Anesthesia Post Note  Patient: Derrick [name]  Procedure(s) Performed: IRRIGATION AND DEBRIDEMENT RIGHT HAND (Right: Hand)     Patient location during evaluation: PACU Anesthesia Type: General Level of consciousness: sedated Pain management: pain level controlled Vital Signs Assessment: post-procedure vital signs reviewed and stable Respiratory status: spontaneous breathing and respiratory function stable Cardiovascular status: stable Postop Assessment: no apparent nausea or vomiting Anesthetic complications: no   No notable events documented.  Last Vitals:  Vitals:   12/27/21 1246 12/27/21 1306  BP: (!) 114/52 121/70  Pulse: 65 64  Resp: 16 20  Temp: 36.7 C 36.4 C  SpO2: 93% 96%    Last Pain:  Vitals:   12/27/21 1246  TempSrc:   PainSc: 0-No pain                 [name]

## 2021-12-27 NOTE — Anesthesia Preprocedure Evaluation (Addendum)
Anesthesia Evaluation  Patient identified by MRN, date of birth, ID band Patient awake    Reviewed: Allergy & Precautions, NPO status , Patient's Chart, lab work & pertinent test results  History of Anesthesia Complications Negative for: history of anesthetic complications  Airway Mallampati: II  TM Distance: >3 FB Neck ROM: Full    Dental no notable dental hx. (+) Dental Advisory Given   Pulmonary Current Smoker and Patient abstained from smoking.,    Pulmonary exam normal        Cardiovascular negative cardio ROS Normal cardiovascular exam     Neuro/Psych negative neurological ROS     GI/Hepatic negative GI ROS, Neg liver ROS,   Endo/Other  negative endocrine ROS  Renal/GU negative Renal ROS     Musculoskeletal negative musculoskeletal ROS (+)   Abdominal   Peds  Hematology negative hematology ROS (+)   Anesthesia Other Findings   Reproductive/Obstetrics                            Anesthesia Physical Anesthesia Plan  ASA: 2  Anesthesia Plan: General   Post-op Pain Management: Celebrex PO (pre-op)*, Tylenol PO (pre-op)*, Regional block* and Minimal or no pain anticipated   Induction: Intravenous  PONV Risk Score and Plan: 1 and Ondansetron and Midazolam  Airway Management Planned: LMA  Additional Equipment:   Intra-op Plan:   Post-operative Plan: Extubation in OR  Informed Consent: I have reviewed the patients History and Physical, chart, labs and discussed the procedure including the risks, benefits and alternatives for the proposed anesthesia with the patient or authorized representative who has indicated his/her understanding and acceptance.     Dental advisory given  Plan Discussed with: Anesthesiologist, CRNA and Surgeon  Anesthesia Plan Comments:       Anesthesia Quick Evaluation
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
## 2021-12-27 NOTE — Plan of Care (Signed)
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
## 2021-12-27 NOTE — Op Note (Signed)
OPERATIVE NOTE  DATE OF PROCEDURE: 12/27/2021  SURGEONS:  Primary: Orene Desanctis, MD  PREOPERATIVE DIAGNOSIS: infected right hand at dorsal hand and ring finger MCP joint, extensor tendon laceration  POSTOPERATIVE DIAGNOSIS: Right ring finger MCP joint septic arthritis, extensor tendon laceration  NAME OF PROCEDURE:   Right ring finger MCP joint arthrotomy, irrigation and drainage of joint for septic arthritis Right dorsal hand abscess incision and drainage, deep down to level of metacarpal, bone cortex not opened Right ring finger extensor digitorum communis extensor tendon repair  ANESTHESIA: Block and LMA  SKIN PREPARATION: Hibiclens  ESTIMATED BLOOD LOSS: Minimal  IMPLANTS: none  INDICATIONS:  Derrick [name] is a 31 y.o. male who has the above preoperative diagnosis. The patient has decided to proceed with surgical intervention.  Risks, benefits and alternatives of operative management were discussed including, but not limited to, risks of anesthesia complications, infection, pain, persistent symptoms, stiffness, need for future surgery.  The patient understands, agrees and elects to proceed with surgery.    DESCRIPTION OF PROCEDURE: The patient was met in the pre-operative area and their identity was verified.  The operative location and laterality was also verified and marked.  The patient was brought to the OR and was placed supine on the table.  After repeat patient identification with the operative team anesthesia was provided and the patient was prepped and draped in the usual sterile fashion.  A final timeout was performed verifying the correction patient, procedure, location and laterality.  The right upper extremity was elevated and tourniquet inflated to 250 mmHg.  A longitudinal incision was made over the dorsal aspect of the right ring finger at the level of the open wound over the MCP joint.  This was extended proximally and distally throughout the zone of injury.  There is  immediate purulence present.  This was evacuated.  The abscess extended over the dorsal hand and there was a clear laceration of the extensor tendon of the right ring finger extensor digitorum communis tendon.  This traveled down through the extensor tendon down to the level of the joint capsule and into the MCP joint.  There was no gross purulence within the MCP joint however clearly there was trauma down to the metacarpal head at the dorsal aspect cartilaginous surface with some cartilage loss along the dorsal third.  This was debrided thorough incision and drainage of the right ring finger MCP joint was performed with normal saline.  The debridement was performed with a 15 blade scalpel this was an excisional debridement down to the level of the joint and the debridement extended proximally to the dorsal aspect of the hand where there was a separate abscess down to the level of the ring finger metacarpal.  The bone cortex was not entered however the dorsal aspect of the metacarpal was debrided with a 15 blade scalpel of all devitalized tissue and purulent material.  At this time the entire area was thoroughly irrigated with 3 L of normal saline via low flow cystoscopy tubing.  Of note the extensor tendon laceration was proximal to the level of where the MCP joint was entered.  A separate incision was utilized through the extensor tendon in order to gain access to the MCP joint at the more proximal location where the tendon was lacerated and was severely frayed.  This was debrided and then repaired with a 4-0 PDS suture.  The capsule was left open to the right ring finger MCP joint however the overlying extensor tendon was also repaired  separately.  Careful hemostasis was obtained with bipolar.  The skin was then closed with 4-0 nylon suture in horizontal mattress fashion.  Tourniquet was deflated and the fingers were pink and warm and well-perfused at the end of the case.  A sterile soft bandage was applied.  The  patient tolerated the procedure well he was awoken from anesthesia and brought to PACU for recovery in stable condition.  Of note after cultures were obtained and sent for microbiology and antibiotics were given in the operating room.   Matt Holmes, MD

## 2021-12-27 NOTE — Plan of Care (Signed)
  Problem: Education: Goal: Knowledge of General Education information will improve Description: Including pain rating scale, medication(s)/side effects and non-pharmacologic comfort measures Outcome: Not Progressing   Problem: Health Behavior/Discharge Planning: Goal: Ability to manage health-related needs will improve Outcome: Not Progressing   Problem: Clinical Measurements: Goal: Ability to maintain clinical measurements within normal limits will improve Outcome: Not Progressing Goal: Will remain free from infection Outcome: Not Progressing Goal: Diagnostic test results will improve Outcome: Not Progressing Goal: Respiratory complications will improve Outcome: Not Progressing Goal: Cardiovascular complication will be avoided Outcome: Not Progressing   Problem: Elimination: Goal: Will not experience complications related to bowel motility Outcome: Not Progressing Goal: Will not experience complications related to urinary retention Outcome: Not Progressing   Problem: Safety: Goal: Ability to remain free from injury will improve Outcome: Not Progressing   Problem: Skin Integrity: Goal: Risk for impaired skin integrity will decrease Outcome: Not Progressing

## 2021-12-27 NOTE — Anesthesia Procedure Notes (Signed)
Procedure Name: LMA Insertion Date/Time: 12/27/2021 11:20 AM  Performed by: Darryl Nestle, CRNAPre-anesthesia Checklist: Patient identified, Emergency Drugs available, Suction available and Patient being monitored Patient Re-evaluated:Patient Re-evaluated prior to induction Oxygen Delivery Method: Circle system utilized Preoxygenation: Pre-oxygenation with 100% oxygen Induction Type: IV induction Ventilation: Mask ventilation without difficulty LMA: LMA inserted LMA Size: 4.0 Tube type: Oral Number of attempts: 1 Placement Confirmation: breath sounds checked- equal and bilateral Tube secured with: Tape Dental Injury: Teeth and Oropharynx as per pre-operative assessment

## 2021-12-27 NOTE — Transfer of Care (Signed)
Immediate Anesthesia Transfer of Care Note  Patient: Derrick [name]  Procedure(s) Performed: IRRIGATION AND DEBRIDEMENT RIGHT HAND (Right: Hand)  Patient Location: PACU  Anesthesia Type:GA combined with regional for post-op pain  Level of Consciousness: awake and alert   Airway & Oxygen Therapy: Patient Spontanous Breathing  Post-op Assessment: Report given to RN and Post -op Vital signs reviewed and stable  Post vital signs: Reviewed and stable  Last Vitals:  Vitals Value Taken Time  BP 126/68 12/27/21 1216  Temp    Pulse 72 12/27/21 1218  Resp 20 12/27/21 1218  SpO2 96 % 12/27/21 1218  Vitals shown include unvalidated device data.  Last Pain:  Vitals:   12/27/21 1031  TempSrc: Oral  PainSc: 0-No pain      Patients Stated Pain Goal: 0 (12/27/21 0306)  Complications: No notable events documented.

## 2021-12-28 ENCOUNTER — Encounter (HOSPITAL_COMMUNITY): Payer: Self-pay | Admitting: Orthopedic Surgery

## 2021-12-28 DIAGNOSIS — M00841 Arthritis due to other bacteria, right hand: Secondary | ICD-10-CM

## 2021-12-28 DIAGNOSIS — D72829 Elevated white blood cell count, unspecified: Secondary | ICD-10-CM

## 2021-12-28 DIAGNOSIS — L03113 Cellulitis of right upper limb: Secondary | ICD-10-CM

## 2021-12-28 LAB — RENAL FUNCTION PANEL
Albumin: 3.9 g/dL (ref 3.5–5.0)
Anion gap: 7 (ref 5–15)
BUN: 12 mg/dL (ref 6–20)
CO2: 21 mmol/L — ABNORMAL LOW (ref 22–32)
Calcium: 9 mg/dL (ref 8.9–10.3)
Chloride: 107 mmol/L (ref 98–111)
Creatinine, Ser: 1.11 mg/dL (ref 0.61–1.24)
GFR, Estimated: >60 mL/min (ref >60)
Glucose, Bld: 114 mg/dL — ABNORMAL HIGH (ref 70–99)
Phosphorus: 2.7 mg/dL (ref 2.5–4.6)
Potassium: 4.4 mmol/L (ref 3.5–5.1)
Sodium: 135 mmol/L (ref 135–145)

## 2021-12-28 LAB — SEDIMENTATION RATE: Sed Rate: 5 mm/hr (ref 0–16)

## 2021-12-28 LAB — C-REACTIVE PROTEIN: CRP: 0.8 mg/dL (ref <1.0)

## 2021-12-28 LAB — CBC
HCT: 44.7 % (ref 39.0–52.0)
Hemoglobin: 15.4 g/dL (ref 13.0–17.0)
MCH: 33.7 pg (ref 26.0–34.0)
MCHC: 34.5 g/dL (ref 30.0–36.0)
MCV: 97.8 fL (ref 80.0–100.0)
Platelets: 267 10*3/uL (ref 150–400)
RBC: 4.57 MIL/uL (ref 4.22–5.81)
RDW: 12.5 % (ref 11.5–15.5)
WBC: 16.4 10*3/uL — ABNORMAL HIGH (ref 4.0–10.5)
nRBC: 0 % (ref 0.0–0.2)

## 2021-12-28 LAB — MAGNESIUM: Magnesium: 2.1 mg/dL (ref 1.7–2.4)

## 2021-12-28 MED ORDER — METRONIDAZOLE 500 MG PO TABS
500.0000 mg | ORAL_TABLET | Freq: Two times a day (BID) | ORAL | Status: DC
  Administered 2021-12-28 – 2021-12-29 (×3): 500 mg via ORAL
  Filled 2021-12-28 (×3): qty 1

## 2021-12-28 MED ORDER — SODIUM CHLORIDE 0.9 % IV SOLN
2.0000 g | Freq: Three times a day (TID) | INTRAVENOUS | Status: DC
  Administered 2021-12-28 – 2021-12-30 (×5): 2 g via INTRAVENOUS
  Filled 2021-12-28 (×5): qty 12.5

## 2021-12-28 NOTE — TOC CAGE-AID Note (Signed)
Transition of Care [facility]) - CAGE-AID Screening   Patient Details  Name: Derrick [name] MRN: [id number] Date of Birth: 1991/06/13  [facility]) CM/SW Contact:    Nadira Single C Tarpley-Carter, LCSWA Phone Number: 12/28/2021, 10:28 AM   Clinical Narrative: Pt participated in Cage-Aid.  Pt stated he does smoke cigarettes.  Pt was not offered resources, due to usage of substance.     Anas Reister Tarpley-Carter, MSW, LCSW-A Pronouns:  She/Her/Hers [facility] HealthTransitions of Care Clinical Social Worker Direct Number:  [phone] [email or web address]  CAGE-AID Screening:    Have You Ever Felt You Ought to Cut Down on Your Drinking or Drug Use?: Yes Have People Annoyed You By Critizing Your Drinking Or Drug Use?: No Have You Felt Bad Or Guilty About Your Drinking Or Drug Use?: No Have You Ever Had a Drink or Used Drugs First Thing In The Morning to Steady Your Nerves or to Get Rid of a Hangover?: No CAGE-AID Score: 1  Substance Abuse Education Offered: Yes  Substance abuse interventions: Transport planner
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
## 2021-12-29 ENCOUNTER — Other Ambulatory Visit (HOSPITAL_COMMUNITY): Payer: Self-pay

## 2021-12-29 ENCOUNTER — Encounter (HOSPITAL_COMMUNITY): Payer: Self-pay | Admitting: Student

## 2021-12-29 LAB — CBC WITH DIFFERENTIAL/PLATELET
Abs Immature Granulocytes: 0.08 10*3/uL — ABNORMAL HIGH (ref 0.00–0.07)
Basophils Absolute: 0.1 10*3/uL (ref 0.0–0.1)
Basophils Relative: 1 %
Eosinophils Absolute: 0.1 10*3/uL (ref 0.0–0.5)
Eosinophils Relative: 1 %
HCT: 41.2 % (ref 39.0–52.0)
Hemoglobin: 14.1 g/dL (ref 13.0–17.0)
Immature Granulocytes: 1 %
Lymphocytes Relative: 35 %
Lymphs Abs: 5.8 10*3/uL — ABNORMAL HIGH (ref 0.7–4.0)
MCH: 33.7 pg (ref 26.0–34.0)
MCHC: 34.2 g/dL (ref 30.0–36.0)
MCV: 98.3 fL (ref 80.0–100.0)
Monocytes Absolute: 1.1 10*3/uL — ABNORMAL HIGH (ref 0.1–1.0)
Monocytes Relative: 7 %
Neutro Abs: 9.3 10*3/uL — ABNORMAL HIGH (ref 1.7–7.7)
Neutrophils Relative %: 55 %
Platelets: 265 10*3/uL (ref 150–400)
RBC: 4.19 MIL/uL — ABNORMAL LOW (ref 4.22–5.81)
RDW: 12.9 % (ref 11.5–15.5)
WBC: 16.5 10*3/uL — ABNORMAL HIGH (ref 4.0–10.5)
nRBC: 0 % (ref 0.0–0.2)

## 2021-12-29 MED ORDER — ORITAVANCIN DIPHOSPHATE 400 MG IV SOLR
1200.0000 mg | Freq: Once | INTRAVENOUS | Status: AC
  Administered 2021-12-29: 1200 mg via INTRAVENOUS
  Filled 2021-12-29: qty 120

## 2021-12-29 NOTE — Plan of Care (Signed)

## 2021-12-29 NOTE — Progress Notes (Signed)
    [facility]    Date of Admission:  12/26/2021   Total days of antibiotics 4          ID: Derrick [name] is a 31 y.o. male with  right hand injury found to have septic arthritis Principal Problem:   Infection of right hand Active Problems:   Tobacco use   Septic arthritis of hand, right (HCC)   Bandemia   Lymphocytosis   Obesity (BMI 30-39.9)    Subjective: Afebrile, still some tenderness to right hand, but less swelling  ROS: 12 point ros is negative  Medications:   metroNIDAZOLE  500 mg Oral Q12H   mupirocin ointment  1 application  Nasal BID    Objective: Vital signs in last 24 hours: Temp:  [97.7 F (36.5 C)-98.3 F (36.8 C)] 97.7 F (36.5 C) (06/15 0806) Pulse Rate:  [59-67] 59 (06/15 0806) Resp:  [16] 16 (06/15 0806) BP: (120-136)/(79-90) 136/90 (06/15 0806) SpO2:  [100 %] 100 % (06/15 0806)  Physical Exam  Constitutional: He is oriented to person, place, and time. He appears well-developed and well-nourished. No distress.  HENT:  Mouth/Throat: Oropharynx is clear and moist. No oropharyngeal exudate.  Cardiovascular: Normal rate, regular rhythm and normal heart sounds. Exam reveals no gallop and no friction rub.  No murmur heard.  Pulmonary/Chest: Effort normal and breath sounds normal. No respiratory distress. He has no wheezes.  Ext: right hand wrapped. Has full range of motion with finger. Mobilized so unable to assess wrist. Neurological: He is alert and oriented to person, place, and time.  Skin: Skin is warm and dry. No rash noted. No erythema.  Psychiatric: He has a normal mood and affect. His behavior is normal.    Lab Results Recent Labs    12/27/21 0553 12/28/21 0153 12/29/21 0142  WBC 12.9* 16.4* 16.5*  HGB 15.8 15.4 14.1  HCT 44.8 44.7 41.2  NA 135 135  --   K 4.0 4.4  --   CL 102 107  --   CO2 22 21*  --   BUN 14 12  --   CREATININE 1.12 1.11  --    Liver Panel Recent Labs    12/26/21 2120  12/28/21 0153  PROT 6.9  --   ALBUMIN 4.2 3.9  AST 29  --   ALT 27  --   ALKPHOS 56  --   BILITOT 1.2  --    Sedimentation Rate Recent Labs    12/28/21 0153  ESRSEDRATE 5   C-Reactive Protein Recent Labs    12/28/21 0153  CRP 0.8    Microbiology: pending Studies/Results: No results found.   Assessment/Plan: Presumed polymicrobial septic arthritis of 2nd digit mcp s/p wash out = will give a dose of oritavancin (for MRSA and GPC coverage). Please also give levofloxacin 750mg  daily #24 days (since he had water exposure). Also dispense doxycycline 100mg  po bid (to start on June 24th) --thus he should be complete all his abtx close to 4 wk.  I will follow up on cx should we need to change regimen. We will also see back in the id clinic in 4 wk  Will sign off  [facility] Pager: [phone]  12/29/2021, 4:52 PM
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
## 2021-12-29 NOTE — Plan of Care (Signed)
  Problem: Nutrition: Goal: Adequate nutrition will be maintained Outcome: Progressing   Problem: Coping: Goal: Level of anxiety will decrease Outcome: Progressing   Problem: Elimination: Goal: Will not experience complications related to bowel motility Outcome: Progressing   Problem: Pain Managment: Goal: General experience of comfort will improve Outcome: Progressing   

## 2021-12-30 ENCOUNTER — Other Ambulatory Visit (HOSPITAL_COMMUNITY): Payer: Self-pay

## 2021-12-30 LAB — CBC
HCT: 43.3 % (ref 39.0–52.0)
Hemoglobin: 14.6 g/dL (ref 13.0–17.0)
MCH: 33.1 pg (ref 26.0–34.0)
MCHC: 33.7 g/dL (ref 30.0–36.0)
MCV: 98.2 fL (ref 80.0–100.0)
Platelets: 288 10*3/uL (ref 150–400)
RBC: 4.41 MIL/uL (ref 4.22–5.81)
RDW: 12.7 % (ref 11.5–15.5)
WBC: 11.2 10*3/uL — ABNORMAL HIGH (ref 4.0–10.5)
nRBC: 0 % (ref 0.0–0.2)

## 2021-12-30 MED ORDER — DOXYCYCLINE HYCLATE 100 MG PO TABS
100.0000 mg | ORAL_TABLET | Freq: Two times a day (BID) | ORAL | 0 refills | Status: AC
  Filled 2021-12-30: qty 36, 18d supply, fill #0

## 2021-12-30 MED ORDER — SENNOSIDES-DOCUSATE SODIUM 8.6-50 MG PO TABS: 1.0000 | ORAL_TABLET | Freq: Every evening | ORAL | Status: AC | PRN

## 2021-12-30 MED ORDER — LEVOFLOXACIN 750 MG PO TABS
750.0000 mg | ORAL_TABLET | Freq: Every day | ORAL | 0 refills | Status: AC
  Filled 2021-12-30: qty 23, 23d supply, fill #0

## 2021-12-30 MED ORDER — ACETAMINOPHEN 325 MG PO TABS: 650.0000 mg | ORAL_TABLET | Freq: Four times a day (QID) | ORAL | Status: AC | PRN

## 2021-12-30 MED ORDER — LEVOFLOXACIN 500 MG PO TABS
750.0000 mg | ORAL_TABLET | Freq: Every day | ORAL | Status: DC
  Administered 2021-12-30: 750 mg via ORAL
  Filled 2021-12-30: qty 2

## 2021-12-30 MED ORDER — HYDROCODONE-ACETAMINOPHEN 5-325 MG PO TABS
1.0000 | ORAL_TABLET | Freq: Four times a day (QID) | ORAL | 0 refills | Status: AC | PRN
  Filled 2021-12-30: qty 20, 5d supply, fill #0
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
## 2022-01-01 LAB — AEROBIC/ANAEROBIC CULTURE W GRAM STAIN (SURGICAL/DEEP WOUND): Gram Stain: NONE SEEN

## 2022-01-26 ENCOUNTER — Inpatient Hospital Stay: Payer: Self-pay | Admitting: Internal Medicine

## 2022-01-30 ENCOUNTER — Inpatient Hospital Stay: Payer: Self-pay | Admitting: Family Medicine

## 2022-12-18 ENCOUNTER — Ambulatory Visit (HOSPITAL_COMMUNITY)
Admission: EM | Admit: 2022-12-18 | Discharge: 2022-12-18 | Disposition: A | Discharge status: Home / Self Care | Payer: Self-pay | Attending: Internal Medicine | Admitting: Internal Medicine

## 2022-12-18 ENCOUNTER — Encounter (HOSPITAL_COMMUNITY): Payer: Self-pay | Admitting: Emergency Medicine

## 2022-12-18 DIAGNOSIS — L237 Allergic contact dermatitis due to plants, except food: Secondary | ICD-10-CM

## 2022-12-18 MED ORDER — TRIAMCINOLONE ACETONIDE 0.1 % EX CREA: 1.0000 | TOPICAL_CREAM | Freq: Two times a day (BID) | CUTANEOUS | 0 refills | Status: AC

## 2022-12-18 NOTE — ED Triage Notes (Signed)
Pt c/o poison ivy rash for 2 days. Got worse last night. Reports on arms, legs. Hasn't tried creams or medications.

## 2022-12-18 NOTE — Discharge Instructions (Addendum)
Apply triamcinolone cream to the poison ivy rash areas twice a day (morning and night) for the next 5-7 days as needed for itch.   You may also use over the counter poison ivy remedies to help dry up lesions.  You may use over the counter zyrtec for itch.  If you develop any new or worsening symptoms or do not improve in the next 2 to 3 days, please return.  If your symptoms are severe, please go to the emergency room.  Follow-up with your primary care provider for further evaluation and management of your symptoms as well as ongoing wellness visits.  I hope you feel better!
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
## 2022-12-24 ENCOUNTER — Ambulatory Visit (HOSPITAL_COMMUNITY)
Admission: EM | Admit: 2022-12-24 | Discharge: 2022-12-24 | Disposition: A | Discharge status: Home / Self Care | Payer: Managed Care, Other (non HMO) | Attending: Emergency Medicine | Admitting: Emergency Medicine

## 2022-12-24 ENCOUNTER — Encounter (HOSPITAL_COMMUNITY): Payer: Self-pay | Admitting: Emergency Medicine

## 2022-12-24 DIAGNOSIS — L237 Allergic contact dermatitis due to plants, except food: Secondary | ICD-10-CM | POA: Diagnosis not present

## 2022-12-24 MED ORDER — PREDNISONE 10 MG (21) PO TBPK: ORAL_TABLET | Freq: Every day | ORAL | 0 refills | Status: AC

## 2022-12-24 MED ORDER — METHYLPREDNISOLONE ACETATE 80 MG/ML IJ SUSP
INTRAMUSCULAR | Status: AC
  Filled 2022-12-24: qty 1

## 2022-12-24 MED ORDER — METHYLPREDNISOLONE ACETATE 80 MG/ML IJ SUSP
60.0000 mg | Freq: Once | INTRAMUSCULAR | Status: AC
  Administered 2022-12-24: 60 mg via INTRAMUSCULAR

## 2022-12-24 NOTE — ED Triage Notes (Signed)
Pt was seen here on 6/3 for poison ivy but has gotten worse. Using cream that was prescribed and calamine lotion.
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
## 2022-12-24 NOTE — Discharge Instructions (Signed)
Today you are being treated for the poison ivy/oak rash  You have been given an injection of steroids today in the office today to help reduce the inflammatory process that occurs with this rash which will help minimize your itching as well as begin to clear  Starting tomorrow take prednisone every morning with food as directed, to continue the above process  You may continue use of topical calamine or Benadryl cream to help manage itching, you may also continue oral Benadryl  Please avoid long exposures to heat such as a hot steamy shower or being outside as this may cause further irritation to your rash  You may follow-up with his urgent care as needed if symptoms persist or worsen 
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
## 2024-08-22 ENCOUNTER — Other Ambulatory Visit: Payer: Self-pay

## 2024-08-22 ENCOUNTER — Encounter (HOSPITAL_COMMUNITY): Payer: Self-pay

## 2024-08-22 ENCOUNTER — Emergency Department (HOSPITAL_COMMUNITY)

## 2024-08-22 ENCOUNTER — Inpatient Hospital Stay (HOSPITAL_COMMUNITY)

## 2024-08-22 ENCOUNTER — Inpatient Hospital Stay (HOSPITAL_COMMUNITY): Admission: EM | Admit: 2024-08-22 | Source: Home / Self Care | Admitting: General Surgery

## 2024-08-22 DIAGNOSIS — S52502B Unspecified fracture of the lower end of left radius, initial encounter for open fracture type I or II: Secondary | ICD-10-CM

## 2024-08-22 DIAGNOSIS — S32402A Unspecified fracture of left acetabulum, initial encounter for closed fracture: Secondary | ICD-10-CM

## 2024-08-22 LAB — COMPREHENSIVE METABOLIC PANEL WITH GFR
ALT: 48 U/L — ABNORMAL HIGH (ref 0–44)
AST: 63 U/L — ABNORMAL HIGH (ref 15–41)
Albumin: 4.3 g/dL (ref 3.5–5.0)
Alkaline Phosphatase: 52 U/L (ref 38–126)
Anion gap: 15 (ref 5–15)
BUN: 12 mg/dL (ref 6–20)
CO2: 18 mmol/L — ABNORMAL LOW (ref 22–32)
Calcium: 8.4 mg/dL — ABNORMAL LOW (ref 8.9–10.3)
Chloride: 105 mmol/L (ref 98–111)
Creatinine, Ser: 1.25 mg/dL — ABNORMAL HIGH (ref 0.61–1.24)
GFR, Estimated: >60 mL/min
Glucose, Bld: 132 mg/dL — ABNORMAL HIGH (ref 70–99)
Potassium: 3.9 mmol/L (ref 3.5–5.1)
Sodium: 138 mmol/L (ref 135–145)
Total Bilirubin: 0.5 mg/dL (ref 0.0–1.2)
Total Protein: 6.8 g/dL (ref 6.5–8.1)

## 2024-08-22 LAB — CBC
HCT: 45 % (ref 39.0–52.0)
Hemoglobin: 14.7 g/dL (ref 13.0–17.0)
MCH: 35.3 pg — ABNORMAL HIGH (ref 26.0–34.0)
MCHC: 32.7 g/dL (ref 30.0–36.0)
MCV: 107.9 fL — ABNORMAL HIGH (ref 80.0–100.0)
Platelets: 246 10*3/uL (ref 150–400)
RBC: 4.17 MIL/uL — ABNORMAL LOW (ref 4.22–5.81)
RDW: 14.1 % (ref 11.5–15.5)
WBC: 12.6 10*3/uL — ABNORMAL HIGH (ref 4.0–10.5)
nRBC: 0 % (ref 0.0–0.2)

## 2024-08-22 LAB — I-STAT CHEM 8, ED
BUN: 15 mg/dL (ref 6–20)
Calcium, Ion: 0.97 mmol/L — ABNORMAL LOW (ref 1.15–1.40)
Chloride: 107 mmol/L (ref 98–111)
Creatinine, Ser: 1.4 mg/dL — ABNORMAL HIGH (ref 0.61–1.24)
Glucose, Bld: 127 mg/dL — ABNORMAL HIGH (ref 70–99)
HCT: 44 % (ref 39.0–52.0)
Hemoglobin: 15 g/dL (ref 13.0–17.0)
Potassium: 4.2 mmol/L (ref 3.5–5.1)
Sodium: 141 mmol/L (ref 135–145)
TCO2: 22 mmol/L (ref 22–32)

## 2024-08-22 LAB — I-STAT CG4 LACTIC ACID, ED: Lactic Acid, Venous: 3.7 mmol/L (ref 0.5–1.9)

## 2024-08-22 LAB — ETHANOL: Alcohol, Ethyl (B): 189 mg/dL — ABNORMAL HIGH

## 2024-08-22 MED ORDER — HYDROCODONE-ACETAMINOPHEN 5-325 MG PO TABS: 2.0000 | ORAL_TABLET | ORAL | Status: AC | PRN

## 2024-08-22 MED ORDER — IOHEXOL 350 MG/ML SOLN
75.0000 mL | Freq: Once | INTRAVENOUS | Status: AC | PRN
  Administered 2024-08-22: 75 mL via INTRAVENOUS

## 2024-08-22 MED ORDER — METHOCARBAMOL 500 MG PO TABS: 500.0000 mg | ORAL_TABLET | Freq: Once | ORAL | Status: DC

## 2024-08-22 MED ORDER — MORPHINE SULFATE (PF) 4 MG/ML IV SOLN
4.0000 mg | Freq: Once | INTRAVENOUS | Status: AC
  Administered 2024-08-22: 4 mg via INTRAVENOUS

## 2024-08-22 MED ORDER — LACTATED RINGERS IV SOLN: INTRAVENOUS | Status: AC

## 2024-08-22 MED ORDER — MORPHINE SULFATE (PF) 4 MG/ML IV SOLN
4.0000 mg | Freq: Once | INTRAVENOUS | Status: AC
  Administered 2024-08-22: 4 mg via INTRAVENOUS
  Filled 2024-08-22: qty 1

## 2024-08-22 MED ORDER — ONDANSETRON HCL 4 MG/2ML IJ SOLN
4.0000 mg | Freq: Once | INTRAMUSCULAR | Status: AC
  Administered 2024-08-22: 4 mg via INTRAVENOUS

## 2024-08-22 MED ORDER — ENOXAPARIN SODIUM 30 MG/0.3ML IJ SOSY: 30.0000 mg | PREFILLED_SYRINGE | Freq: Two times a day (BID) | INTRAMUSCULAR | Status: AC

## 2024-08-22 MED ORDER — DIAZEPAM 5 MG/ML IJ SOLN
5.0000 mg | Freq: Once | INTRAMUSCULAR | Status: AC
  Administered 2024-08-22: 5 mg via INTRAVENOUS
  Filled 2024-08-22: qty 2

## 2024-08-22 MED ORDER — HYDROMORPHONE HCL 1 MG/ML IJ SOLN
1.0000 mg | INTRAMUSCULAR | Status: AC | PRN
  Administered 2024-08-22: 1 mg via INTRAVENOUS
  Filled 2024-08-22: qty 1

## 2024-08-22 MED ORDER — MORPHINE SULFATE (PF) 2 MG/ML IV SOLN
INTRAVENOUS | Status: AC
  Filled 2024-08-22: qty 2

## 2024-08-22 MED ORDER — PROPOFOL 10 MG/ML IV BOLUS
INTRAVENOUS | Status: AC
  Filled 2024-08-22: qty 20

## 2024-08-22 MED ORDER — METOPROLOL TARTRATE 5 MG/5ML IV SOLN: 5.0000 mg | Freq: Four times a day (QID) | INTRAVENOUS | Status: AC | PRN

## 2024-08-22 MED ORDER — METHOCARBAMOL 1000 MG/10ML IJ SOLN
500.0000 mg | Freq: Three times a day (TID) | INTRAMUSCULAR | Status: AC
  Administered 2024-08-22: 500 mg via INTRAVENOUS
  Filled 2024-08-22: qty 10

## 2024-08-22 MED ORDER — ACETAMINOPHEN 500 MG PO TABS: 1000.0000 mg | ORAL_TABLET | Freq: Four times a day (QID) | ORAL | Status: AC

## 2024-08-22 MED ORDER — POLYETHYLENE GLYCOL 3350 17 G PO PACK: 17.0000 g | PACK | Freq: Every day | ORAL | Status: AC | PRN

## 2024-08-22 MED ORDER — ONDANSETRON HCL 4 MG/2ML IJ SOLN: 4.0000 mg | Freq: Four times a day (QID) | INTRAMUSCULAR | Status: AC | PRN

## 2024-08-22 MED ORDER — PROPOFOL 10 MG/ML IV BOLUS
200.0000 mg | Freq: Once | INTRAVENOUS | Status: DC
  Filled 2024-08-22: qty 20

## 2024-08-22 MED ORDER — SODIUM CHLORIDE 0.9 % IV BOLUS
1000.0000 mL | Freq: Once | INTRAVENOUS | Status: AC
  Administered 2024-08-22: 1000 mL via INTRAVENOUS

## 2024-08-22 MED ORDER — TETANUS-DIPHTH-ACELL PERTUSSIS 5-2-15.5 LF-MCG/0.5 IM SUSP
0.5000 mL | Freq: Once | INTRAMUSCULAR | Status: AC
  Administered 2024-08-22: 0.5 mL via INTRAMUSCULAR
  Filled 2024-08-22: qty 0.5

## 2024-08-22 MED ORDER — CEFAZOLIN SODIUM-DEXTROSE 2-4 GM/100ML-% IV SOLN
2.0000 g | Freq: Once | INTRAVENOUS | Status: AC
  Administered 2024-08-22: 2 g via INTRAVENOUS
  Filled 2024-08-22: qty 100

## 2024-08-22 MED ORDER — LACTATED RINGERS IV BOLUS
2000.0000 mL | Freq: Once | INTRAVENOUS | Status: AC
  Administered 2024-08-22: 2000 mL via INTRAVENOUS

## 2024-08-22 MED ORDER — METHOCARBAMOL 500 MG PO TABS: 500.0000 mg | ORAL_TABLET | Freq: Three times a day (TID) | ORAL | Status: AC

## 2024-08-22 MED ORDER — PROPOFOL 10 MG/ML IV BOLUS
INTRAVENOUS | Status: AC | PRN
  Administered 2024-08-22: 50 ug via INTRAVENOUS
  Administered 2024-08-22: 2014 ug via INTRAVENOUS
  Administered 2024-08-22 (×2): 50 ug via INTRAVENOUS

## 2024-08-22 MED ORDER — HYDRALAZINE HCL 20 MG/ML IJ SOLN: 10.0000 mg | INTRAMUSCULAR | Status: AC | PRN

## 2024-08-22 MED ORDER — ONDANSETRON 4 MG PO TBDP: 4.0000 mg | ORAL_TABLET | Freq: Four times a day (QID) | ORAL | Status: AC | PRN

## 2024-08-22 MED ORDER — SENNA 8.6 MG PO TABS: 1.0000 | ORAL_TABLET | Freq: Two times a day (BID) | ORAL | Status: AC

## 2024-08-22 NOTE — Progress Notes (Signed)
 RT responded to conscious sedation. Patient placed on ETCO2 monitoring for procedure.
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
## 2024-08-22 NOTE — ED Notes (Signed)
 Right arm with open fracture
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
## 2024-08-22 NOTE — Progress Notes (Signed)
 Orthopedic Tech Progress Note Patient Details:  Derrick [name] 04/18/1991 [id number] Conscious sedation/reduction w/ ER Med team present. Sugar tong applied for stabilization of open fx. Arm sling at bedside for postop support. Ortho Devices Type of Ortho Device: Ace wrap, Cotton web roll, Arm sling, Sugartong splint, Finger trap Finger Trap Weight: 10 Ortho Device/Splint Location: R WRIST Ortho Device/Splint Interventions: Ordered, Application   Post Interventions Patient Tolerated: Fair Instructions Provided: Care of device  Derrick [name] 08/22/2024, 9:30 PM

## 2024-08-22 NOTE — ED Notes (Signed)
MD notified of lactic 3.7

## 2024-08-23 ENCOUNTER — Encounter (HOSPITAL_COMMUNITY): Admission: EM | Payer: Self-pay | Source: Home / Self Care
